# Patient Record
Sex: Female | Born: 1950
Health system: Southern US, Community
[De-identification: ages and names within clinical notes are randomized; demographics above are authoritative.]

## PROBLEM LIST (undated history)

## (undated) DIAGNOSIS — A048 Other specified bacterial intestinal infections: Secondary | ICD-10-CM

## (undated) DIAGNOSIS — K219 Gastro-esophageal reflux disease without esophagitis: Secondary | ICD-10-CM

## (undated) DIAGNOSIS — J45909 Unspecified asthma, uncomplicated: Secondary | ICD-10-CM

## (undated) DIAGNOSIS — E079 Disorder of thyroid, unspecified: Secondary | ICD-10-CM

## (undated) DIAGNOSIS — R42 Dizziness and giddiness: Secondary | ICD-10-CM

## (undated) DIAGNOSIS — I10 Essential (primary) hypertension: Secondary | ICD-10-CM

## (undated) DIAGNOSIS — C50919 Malignant neoplasm of unspecified site of unspecified female breast: Secondary | ICD-10-CM

## (undated) DIAGNOSIS — N200 Calculus of kidney: Secondary | ICD-10-CM

## (undated) HISTORY — PX: THYROIDECTOMY, PARTIAL: SHX18

## (undated) HISTORY — DX: Unspecified asthma, uncomplicated: J45.909

## (undated) HISTORY — DX: Gastro-esophageal reflux disease without esophagitis: K21.9

## (undated) HISTORY — PX: PARTIAL HYSTERECTOMY: SHX80

## (undated) HISTORY — DX: Essential (primary) hypertension: I10

## (undated) HISTORY — DX: Dizziness and giddiness: R42

## (undated) HISTORY — PX: MASTECTOMY: SHX3

## (undated) HISTORY — DX: Calculus of kidney: N20.0

## (undated) HISTORY — PX: ABDOMINAL HYSTERECTOMY: SHX81

## (undated) HISTORY — DX: Disorder of thyroid, unspecified: E07.9

---

## 2002-05-21 ENCOUNTER — Ambulatory Visit (HOSPITAL_COMMUNITY): Admission: RE | Admit: 2002-05-21 | Discharge: 2002-05-21 | Payer: Self-pay | Admitting: Surgery

## 2002-05-21 ENCOUNTER — Encounter: Payer: Self-pay | Admitting: Surgery

## 2002-06-23 ENCOUNTER — Ambulatory Visit (HOSPITAL_COMMUNITY): Admission: RE | Admit: 2002-06-23 | Discharge: 2002-06-24 | Payer: Self-pay | Admitting: Surgery

## 2013-07-23 DIAGNOSIS — I10 Essential (primary) hypertension: Secondary | ICD-10-CM | POA: Diagnosis not present

## 2013-07-30 DIAGNOSIS — I10 Essential (primary) hypertension: Secondary | ICD-10-CM | POA: Diagnosis not present

## 2013-07-30 DIAGNOSIS — Z1331 Encounter for screening for depression: Secondary | ICD-10-CM | POA: Diagnosis not present

## 2013-07-30 DIAGNOSIS — K219 Gastro-esophageal reflux disease without esophagitis: Secondary | ICD-10-CM | POA: Diagnosis not present

## 2013-07-30 DIAGNOSIS — K5900 Constipation, unspecified: Secondary | ICD-10-CM | POA: Diagnosis not present

## 2013-07-30 DIAGNOSIS — J45902 Unspecified asthma with status asthmaticus: Secondary | ICD-10-CM | POA: Diagnosis not present

## 2013-07-30 DIAGNOSIS — Z0000 Encounter for general adult medical examination without abnormal findings: Secondary | ICD-10-CM | POA: Diagnosis not present

## 2013-07-30 DIAGNOSIS — R7309 Other abnormal glucose: Secondary | ICD-10-CM | POA: Diagnosis not present

## 2013-08-06 DIAGNOSIS — H1045 Other chronic allergic conjunctivitis: Secondary | ICD-10-CM | POA: Diagnosis not present

## 2013-08-06 DIAGNOSIS — H2589 Other age-related cataract: Secondary | ICD-10-CM | POA: Diagnosis not present

## 2013-08-06 DIAGNOSIS — H538 Other visual disturbances: Secondary | ICD-10-CM | POA: Diagnosis not present

## 2013-08-06 DIAGNOSIS — H40029 Open angle with borderline findings, high risk, unspecified eye: Secondary | ICD-10-CM | POA: Diagnosis not present

## 2013-08-06 DIAGNOSIS — H43399 Other vitreous opacities, unspecified eye: Secondary | ICD-10-CM | POA: Diagnosis not present

## 2013-08-13 DIAGNOSIS — Z1231 Encounter for screening mammogram for malignant neoplasm of breast: Secondary | ICD-10-CM | POA: Diagnosis not present

## 2013-10-22 DIAGNOSIS — M6530 Trigger finger, unspecified finger: Secondary | ICD-10-CM | POA: Diagnosis not present

## 2013-11-14 DIAGNOSIS — R509 Fever, unspecified: Secondary | ICD-10-CM | POA: Diagnosis not present

## 2013-11-14 DIAGNOSIS — J45902 Unspecified asthma with status asthmaticus: Secondary | ICD-10-CM | POA: Diagnosis not present

## 2013-11-14 DIAGNOSIS — R05 Cough: Secondary | ICD-10-CM | POA: Diagnosis not present

## 2013-11-14 DIAGNOSIS — R059 Cough, unspecified: Secondary | ICD-10-CM | POA: Diagnosis not present

## 2013-11-28 DIAGNOSIS — H40023 Open angle with borderline findings, high risk, bilateral: Secondary | ICD-10-CM | POA: Diagnosis not present

## 2013-12-11 DIAGNOSIS — R739 Hyperglycemia, unspecified: Secondary | ICD-10-CM | POA: Diagnosis not present

## 2013-12-11 DIAGNOSIS — I10 Essential (primary) hypertension: Secondary | ICD-10-CM | POA: Diagnosis not present

## 2013-12-17 DIAGNOSIS — R739 Hyperglycemia, unspecified: Secondary | ICD-10-CM | POA: Diagnosis not present

## 2013-12-17 DIAGNOSIS — I10 Essential (primary) hypertension: Secondary | ICD-10-CM | POA: Diagnosis not present

## 2013-12-17 DIAGNOSIS — J4521 Mild intermittent asthma with (acute) exacerbation: Secondary | ICD-10-CM | POA: Diagnosis not present

## 2014-08-14 DIAGNOSIS — R739 Hyperglycemia, unspecified: Secondary | ICD-10-CM | POA: Diagnosis not present

## 2014-08-14 DIAGNOSIS — Z0000 Encounter for general adult medical examination without abnormal findings: Secondary | ICD-10-CM | POA: Diagnosis not present

## 2014-08-14 DIAGNOSIS — E780 Pure hypercholesterolemia: Secondary | ICD-10-CM | POA: Diagnosis not present

## 2014-08-14 DIAGNOSIS — I10 Essential (primary) hypertension: Secondary | ICD-10-CM | POA: Diagnosis not present

## 2014-08-28 DIAGNOSIS — R739 Hyperglycemia, unspecified: Secondary | ICD-10-CM | POA: Diagnosis not present

## 2014-08-28 DIAGNOSIS — K219 Gastro-esophageal reflux disease without esophagitis: Secondary | ICD-10-CM | POA: Diagnosis not present

## 2014-08-28 DIAGNOSIS — Z0000 Encounter for general adult medical examination without abnormal findings: Secondary | ICD-10-CM | POA: Diagnosis not present

## 2014-08-28 DIAGNOSIS — Z1231 Encounter for screening mammogram for malignant neoplasm of breast: Secondary | ICD-10-CM | POA: Diagnosis not present

## 2014-08-28 DIAGNOSIS — K5909 Other constipation: Secondary | ICD-10-CM | POA: Diagnosis not present

## 2014-08-28 DIAGNOSIS — J4521 Mild intermittent asthma with (acute) exacerbation: Secondary | ICD-10-CM | POA: Diagnosis not present

## 2014-08-28 DIAGNOSIS — Z1389 Encounter for screening for other disorder: Secondary | ICD-10-CM | POA: Diagnosis not present

## 2014-08-28 DIAGNOSIS — I10 Essential (primary) hypertension: Secondary | ICD-10-CM | POA: Diagnosis not present

## 2014-10-21 DIAGNOSIS — R109 Unspecified abdominal pain: Secondary | ICD-10-CM | POA: Diagnosis not present

## 2015-09-23 DIAGNOSIS — Z1231 Encounter for screening mammogram for malignant neoplasm of breast: Secondary | ICD-10-CM | POA: Diagnosis not present

## 2015-10-01 DIAGNOSIS — I10 Essential (primary) hypertension: Secondary | ICD-10-CM | POA: Diagnosis not present

## 2015-10-01 DIAGNOSIS — R739 Hyperglycemia, unspecified: Secondary | ICD-10-CM | POA: Diagnosis not present

## 2015-10-01 DIAGNOSIS — K219 Gastro-esophageal reflux disease without esophagitis: Secondary | ICD-10-CM | POA: Diagnosis not present

## 2015-10-08 DIAGNOSIS — I10 Essential (primary) hypertension: Secondary | ICD-10-CM | POA: Diagnosis not present

## 2015-10-08 DIAGNOSIS — Z0000 Encounter for general adult medical examination without abnormal findings: Secondary | ICD-10-CM | POA: Diagnosis not present

## 2015-10-08 DIAGNOSIS — Z1389 Encounter for screening for other disorder: Secondary | ICD-10-CM | POA: Diagnosis not present

## 2015-10-08 DIAGNOSIS — K5909 Other constipation: Secondary | ICD-10-CM | POA: Diagnosis not present

## 2015-10-08 DIAGNOSIS — K219 Gastro-esophageal reflux disease without esophagitis: Secondary | ICD-10-CM | POA: Diagnosis not present

## 2015-10-08 DIAGNOSIS — J4521 Mild intermittent asthma with (acute) exacerbation: Secondary | ICD-10-CM | POA: Diagnosis not present

## 2015-10-08 DIAGNOSIS — R109 Unspecified abdominal pain: Secondary | ICD-10-CM | POA: Diagnosis not present

## 2015-10-08 DIAGNOSIS — Z6827 Body mass index (BMI) 27.0-27.9, adult: Secondary | ICD-10-CM | POA: Diagnosis not present

## 2015-10-21 ENCOUNTER — Other Ambulatory Visit: Payer: Self-pay

## 2015-10-28 DIAGNOSIS — Z6827 Body mass index (BMI) 27.0-27.9, adult: Secondary | ICD-10-CM | POA: Diagnosis not present

## 2015-10-28 DIAGNOSIS — I10 Essential (primary) hypertension: Secondary | ICD-10-CM | POA: Diagnosis not present

## 2015-10-28 DIAGNOSIS — R109 Unspecified abdominal pain: Secondary | ICD-10-CM | POA: Diagnosis not present

## 2015-11-11 DIAGNOSIS — Z23 Encounter for immunization: Secondary | ICD-10-CM | POA: Diagnosis not present

## 2015-12-07 DIAGNOSIS — K219 Gastro-esophageal reflux disease without esophagitis: Secondary | ICD-10-CM | POA: Diagnosis not present

## 2015-12-07 DIAGNOSIS — Z6827 Body mass index (BMI) 27.0-27.9, adult: Secondary | ICD-10-CM | POA: Diagnosis not present

## 2015-12-07 DIAGNOSIS — I10 Essential (primary) hypertension: Secondary | ICD-10-CM | POA: Diagnosis not present

## 2015-12-10 DIAGNOSIS — R1031 Right lower quadrant pain: Secondary | ICD-10-CM | POA: Diagnosis not present

## 2015-12-10 DIAGNOSIS — N200 Calculus of kidney: Secondary | ICD-10-CM | POA: Diagnosis not present

## 2016-01-31 DIAGNOSIS — Z6827 Body mass index (BMI) 27.0-27.9, adult: Secondary | ICD-10-CM | POA: Diagnosis not present

## 2016-01-31 DIAGNOSIS — R05 Cough: Secondary | ICD-10-CM | POA: Diagnosis not present

## 2016-01-31 DIAGNOSIS — R0981 Nasal congestion: Secondary | ICD-10-CM | POA: Diagnosis not present

## 2016-02-03 ENCOUNTER — Encounter (INDEPENDENT_AMBULATORY_CARE_PROVIDER_SITE_OTHER): Payer: Self-pay

## 2016-02-03 ENCOUNTER — Encounter (INDEPENDENT_AMBULATORY_CARE_PROVIDER_SITE_OTHER): Payer: Self-pay | Admitting: Internal Medicine

## 2016-02-23 ENCOUNTER — Encounter (INDEPENDENT_AMBULATORY_CARE_PROVIDER_SITE_OTHER): Payer: Self-pay | Admitting: *Deleted

## 2016-02-23 ENCOUNTER — Ambulatory Visit (INDEPENDENT_AMBULATORY_CARE_PROVIDER_SITE_OTHER): Payer: Medicare Other | Admitting: Internal Medicine

## 2016-02-23 ENCOUNTER — Encounter (INDEPENDENT_AMBULATORY_CARE_PROVIDER_SITE_OTHER): Payer: Self-pay | Admitting: Internal Medicine

## 2016-02-23 ENCOUNTER — Other Ambulatory Visit (INDEPENDENT_AMBULATORY_CARE_PROVIDER_SITE_OTHER): Payer: Self-pay | Admitting: Internal Medicine

## 2016-02-23 DIAGNOSIS — K219 Gastro-esophageal reflux disease without esophagitis: Secondary | ICD-10-CM

## 2016-02-23 DIAGNOSIS — J45909 Unspecified asthma, uncomplicated: Secondary | ICD-10-CM

## 2016-02-23 DIAGNOSIS — J4520 Mild intermittent asthma, uncomplicated: Secondary | ICD-10-CM

## 2016-02-23 DIAGNOSIS — N200 Calculus of kidney: Secondary | ICD-10-CM

## 2016-02-23 HISTORY — DX: Unspecified asthma, uncomplicated: J45.909

## 2016-02-23 NOTE — Patient Instructions (Signed)
EGD. The risks and benefits such as perforation, bleeding, and infection were reviewed with the patient and is agreeable. 

## 2016-02-23 NOTE — Progress Notes (Signed)
   Subjective:    Patient ID: Eileen Mccoy, female    DOB: 09/16/1950, 66 y.o.   MRN: [id number]  HPIReferred by Dr. Nadara Mustard for GERD.  Per records she had a colonoscopy in 2011 which was normal.  She tells me she has GERD and she takes Omeprazole for this. She says if she stops the Omeprazole, she has acid reflux.  Has been on Omeprazole for about 2 yrs. She says OTC Omeprazole worked but was very expensive.  She denies abdominal pain.  She has a BM about twice a week with Senna and a stool softener. Her appetite is good. No weight loss.  No melena or BRRB.  No NSAIDs.  Stopped ASA 81mg  in September. She avoid spicy foods.   10/01/2015 H and H 11.6 and 35.5 HA1C 6.4  12/10/2015 CT abdomen pelvis wo CM: LLQ pain: Tiny non-obstructing renal alculi. No acute abnormality to correspond with the patient's clinical symptomatology.  Review of Systems Past Medical History:  Diagnosis Date  . Asthma 02/23/2016  . Kidney stone 02/23/2016    No past surgical history on file.  Allergies  Allergen Reactions  . Codeine     Stomach ache, nausea    No current outpatient prescriptions on file prior to visit.   No current facility-administered medications on file prior to visit.    Current Outpatient Prescriptions  Medication Sig Dispense Refill  . acetaminophen (TYLENOL) 325 MG tablet Take 650 mg by mouth every 6 (six) hours as needed.    . Albuterol Sulfate 108 (90 Base) MCG/ACT AEPB Inhale into the lungs.    . bisacodyl (DULCOLAX) 5 MG EC tablet Take 5 mg by mouth daily as needed for moderate constipation.    . calcium carbonate (OS-CAL) 1250 (500 Ca) MG chewable tablet Chew 1 tablet by mouth daily. 600mg  a day    . diphenhydrAMINE (BENADRYL) 25 MG tablet Take 25 mg by mouth every 6 (six) hours as needed.    . Fluticasone-Salmeterol (ADVAIR) 250-50 MCG/DOSE AEPB Inhale 1 puff into the lungs 2 (two) times daily.    . Multiple Vitamin (MULTIVITAMIN) tablet Take 1 tablet by mouth daily.    Marland Kitchen  Naphazoline-Pheniramine (OPCON-A OP) Apply to eye.    Marland Kitchen omeprazole (PRILOSEC) 20 MG capsule Take 20 mg by mouth daily.    . raloxifene (EVISTA) 60 MG tablet Take 60 mg by mouth daily.    [name] Sodium (SENNA S PO) Take by mouth as needed.    . vitamin E 400 UNIT capsule Take 400 Units by mouth daily.     No current facility-administered medications for this visit.           Objective:   Physical Exam Blood pressure (!) 142/70, pulse 84, temperature 98.2 F (36.8 C), height 5' 2.5" (1.588 m), weight 158 lb 12.8 oz (72 kg). Alert and oriented. Skin warm and dry. Oral mucosa is moist.   . Sclera anicteric, conjunctivae is pink. Thyroid not enlarged. No cervical lymphadenopathy. Lungs clear. Heart regular rate and rhythm.  Abdomen is soft. Bowel sounds are positive. No hepatomegaly. No abdominal masses felt. No tenderness.  No edema to lower extremities.         Assessment & Plan:  GERD. Controlled with Omeprazole.  Patient is requesting an EGD. She is interested in coming off the Omeprazole. PUD needs to be ruled out.

## 2016-02-25 ENCOUNTER — Encounter (INDEPENDENT_AMBULATORY_CARE_PROVIDER_SITE_OTHER): Payer: Self-pay

## 2016-03-16 ENCOUNTER — Ambulatory Visit (HOSPITAL_COMMUNITY)
Admission: RE | Admit: 2016-03-16 | Discharge: 2016-03-16 | Disposition: A | Discharge status: Home / Self Care | Payer: Medicare Other | Source: Ambulatory Visit | Attending: Internal Medicine | Admitting: Internal Medicine

## 2016-03-16 ENCOUNTER — Encounter (HOSPITAL_COMMUNITY)
Admission: RE | Disposition: A | Discharge status: Home / Self Care | Payer: Self-pay | Source: Ambulatory Visit | Attending: Internal Medicine

## 2016-03-16 ENCOUNTER — Encounter (HOSPITAL_COMMUNITY): Payer: Self-pay | Admitting: *Deleted

## 2016-03-16 DIAGNOSIS — K317 Polyp of stomach and duodenum: Secondary | ICD-10-CM | POA: Diagnosis not present

## 2016-03-16 DIAGNOSIS — B9681 Helicobacter pylori [H. pylori] as the cause of diseases classified elsewhere: Secondary | ICD-10-CM | POA: Diagnosis not present

## 2016-03-16 DIAGNOSIS — Z7951 Long term (current) use of inhaled steroids: Secondary | ICD-10-CM | POA: Diagnosis not present

## 2016-03-16 DIAGNOSIS — K2970 Gastritis, unspecified, without bleeding: Secondary | ICD-10-CM | POA: Insufficient documentation

## 2016-03-16 DIAGNOSIS — K219 Gastro-esophageal reflux disease without esophagitis: Secondary | ICD-10-CM | POA: Diagnosis not present

## 2016-03-16 DIAGNOSIS — J45909 Unspecified asthma, uncomplicated: Secondary | ICD-10-CM | POA: Diagnosis not present

## 2016-03-16 DIAGNOSIS — Z79899 Other long term (current) drug therapy: Secondary | ICD-10-CM | POA: Insufficient documentation

## 2016-03-16 DIAGNOSIS — K228 Other specified diseases of esophagus: Secondary | ICD-10-CM | POA: Diagnosis not present

## 2016-03-16 HISTORY — PX: POLYPECTOMY: SHX5525

## 2016-03-16 HISTORY — PX: BIOPSY: SHX5522

## 2016-03-16 HISTORY — PX: ESOPHAGOGASTRODUODENOSCOPY: SHX5428

## 2016-03-16 SURGERY — EGD (ESOPHAGOGASTRODUODENOSCOPY)
Anesthesia: Moderate Sedation

## 2016-03-16 MED ORDER — MIDAZOLAM HCL 5 MG/5ML IJ SOLN
INTRAMUSCULAR | Status: AC
  Filled 2016-03-16: qty 10

## 2016-03-16 MED ORDER — MEPERIDINE HCL 50 MG/ML IJ SOLN
INTRAMUSCULAR | Status: DC | PRN
  Administered 2016-03-16 (×2): 25 mg via INTRAVENOUS

## 2016-03-16 MED ORDER — ONDANSETRON 4 MG PO TBDP
4.0000 mg | ORAL_TABLET | Freq: Once | ORAL | Status: AC
  Administered 2016-03-16: 4 mg via ORAL

## 2016-03-16 MED ORDER — STERILE WATER FOR IRRIGATION IR SOLN
Status: DC | PRN
  Administered 2016-03-16: 2.5 mL

## 2016-03-16 MED ORDER — ONDANSETRON 4 MG PO TBDP
ORAL_TABLET | ORAL | Status: AC
  Filled 2016-03-16: qty 1

## 2016-03-16 MED ORDER — SODIUM CHLORIDE 0.9 % IV SOLN
INTRAVENOUS | Status: DC
  Administered 2016-03-16: 14:00:00 via INTRAVENOUS

## 2016-03-16 MED ORDER — MEPERIDINE HCL 50 MG/ML IJ SOLN
INTRAMUSCULAR | Status: AC
  Filled 2016-03-16: qty 1

## 2016-03-16 MED ORDER — BUTAMBEN-TETRACAINE-BENZOCAINE 2-2-14 % EX AERO
INHALATION_SPRAY | CUTANEOUS | Status: DC | PRN
  Administered 2016-03-16: 2 via TOPICAL

## 2016-03-16 MED ORDER — MIDAZOLAM HCL 5 MG/5ML IJ SOLN
INTRAMUSCULAR | Status: DC | PRN
  Administered 2016-03-16 (×3): 2 mg via INTRAVENOUS

## 2016-03-16 NOTE — Op Note (Signed)
[facility] Patient Name: Eileen Mccoy Procedure Date: 03/16/2016 2:17 PM MRN: [id number] Date of Birth: 14-Nov-1950 Attending MD: Hildred Laser , MD CSN: [id number] Age: 66 Admit Type: Outpatient Procedure:                Upper GI endoscopy Indications:              Follow-up of gastro-esophageal reflux disease Providers:                Hildred Laser, MD, Otis Peak B. Sharon Seller, RN, Aram Candela Referring MD:             Rory Percy, MD Medicines:                Cetacaine spray, Meperidine 50 mg IV, Midazolam 6                            mg IV Complications:            No immediate complications. Estimated Blood Loss:     Estimated blood loss was minimal. Procedure:                Pre-Anesthesia Assessment:                           - Prior to the procedure, a History and Physical                            was performed, and patient medications and                            allergies were reviewed. The patient's tolerance of                            previous anesthesia was also reviewed. The risks                            and benefits of the procedure and the sedation                            options and risks were discussed with the patient.                            All questions were answered, and informed consent                            was obtained. Prior Anticoagulants: The patient has                            taken no previous anticoagulant or antiplatelet                            agents. ASA Grade Assessment: II - A patient with  mild systemic disease. After reviewing the risks                            and benefits, the patient was deemed in                            satisfactory condition to undergo the procedure.                           After obtaining informed consent, the endoscope was                            passed under direct vision. Throughout the                            procedure,  the patient's blood pressure, pulse, and                            oxygen saturations were monitored continuously. The                            [id number]) scope was introduced through the                            mouth, and advanced to the second part of duodenum.                            The upper GI endoscopy was accomplished without                            difficulty. The patient tolerated the procedure                            well. Scope In: 2:49:40 PM Scope Out: 2:58:30 PM Total Procedure Duration: 0 hours 8 minutes 50 seconds  Findings:      The examined esophagus was normal.      The Z-line was irregular and was found 39 cm from the incisors.      Diffuse mild inflammation characterized by congestion (edema), erythema       and granularity was found in the gastric antrum. Biopsies were taken       with a cold forceps for histology.      Two small sessile polyps with no stigmata of recent bleeding were found       in the gastric antrum. Biopsies were taken with a cold forceps for       histology.      The exam of the stomach was otherwise normal.      The duodenal bulb and second portion of the duodenum were normal. Impression:               - Normal esophagus.                           - Z-line irregular, 39 cm from the incisors.                           -  Gastritis. Biopsied.                           - Two gastric polyps. Biopsied.                           - Normal duodenal bulb and second portion of the                            duodenum.                           Comment: No evidence of erosive esophagitis or                            Barretts. Moderate Sedation:      Moderate (conscious) sedation was administered by the endoscopy nurse       and supervised by the endoscopist. The following parameters were       monitored: oxygen saturation, heart rate, blood pressure, CO2       capnography and response to care. Total physician intraservice time  was       16 minutes. Recommendation:           - Patient has a contact number available for                            emergencies. The signs and symptoms of potential                            delayed complications were discussed with the                            patient. Return to normal activities tomorrow.                            Written discharge instructions were provided to the                            patient.                           - Resume previous diet today.                           - Continue present medications.                           - Await pathology results. Procedure Code(s):        --- Professional ---                           [phone], Esophagogastroduodenoscopy, flexible,                            transoral; with biopsy, single or multiple                           99152,  Moderate sedation services provided by the                            same physician or other qualified health care                            professional performing the diagnostic or                            therapeutic service that the sedation supports,                            requiring the presence of an independent trained                            observer to assist in the monitoring of the                            patient's level of consciousness and physiological                            status; initial 15 minutes of intraservice time,                            patient age 47 years or older Diagnosis Code(s):        --- Professional ---                           K22.8, Other specified diseases of esophagus                           K29.70, Gastritis, unspecified, without bleeding                           K31.7, Polyp of stomach and duodenum                           K21.9, Gastro-esophageal reflux disease without                            esophagitis CPT copyright 2016 American Medical Association. All rights reserved. The codes documented in this report are  preliminary and upon coder review may  be revised to meet current compliance requirements. Hildred Laser, MD Hildred Laser, MD 03/16/2016 3:12:06 PM This report has been signed electronically. Number of Addenda: 0

## 2016-03-16 NOTE — OR Nursing (Signed)
Patient nauseated and vomiting  Dr. Laural Golden notified 4mg  zofran disintegrating tablet ordered and given.

## 2016-03-16 NOTE — H&P (Signed)
Eileen Mccoy is an 66 y.o. female.   Chief Complaint: Patient is here for EGD. HPI: 66 year old African-American female was at symptoms of GERD for about 5 years. She has been OTC medications because she is on omeprazole 20 mg daily and her symptoms are well controlled. He generally has heartburn and nocturnal regurgitation. She is considering to come off PPI and was to make sure she does not have chronic changes i.e. Barrett's. He denies nausea vomiting dysphagia or abdominal pain weight loss or melena.  Past Medical History:  Diagnosis Date  . Asthma 02/23/2016  . Kidney stone 02/23/2016    Past Surgical History:  Procedure Laterality Date  . PARTIAL HYSTERECTOMY     1998 for fibroids  . THYROIDECTOMY, PARTIAL      History reviewed. No pertinent family history. Social History:  reports that she has never smoked. She has never used smokeless tobacco. She reports that she drinks alcohol. Her drug history is not on file.  Allergies:  Allergies  Allergen Reactions  . Codeine     Stomach ache, nausea    Medications Prior to Admission  Medication Sig Dispense Refill  . acetaminophen (TYLENOL) 650 MG CR tablet Take 650 mg by mouth every 8 (eight) hours as needed for pain.    . bisacodyl (DULCOLAX) 5 MG EC tablet Take 5 mg by mouth daily as needed for moderate constipation.    . Calcium Carbonate (CALCIUM 600 PO) Take 600 mg by mouth daily.    . Fluticasone-Salmeterol (ADVAIR) 250-50 MCG/DOSE AEPB Inhale 1 puff into the lungs 2 (two) times daily.    . Multiple Vitamin (MULTIVITAMIN) tablet Take 1 tablet by mouth daily.    . Naphazoline-Pheniramine (OPCON-A OP) Apply 1 drop to eye daily as needed (allergies).     Marland Kitchen omeprazole (PRILOSEC) 20 MG capsule Take 20 mg by mouth daily.    . raloxifene (EVISTA) 60 MG tablet Take 60 mg by mouth daily.    [name] Sodium (SENNA S PO) Take 1 tablet by mouth as needed (constipation).     . vitamin E 400 UNIT capsule Take 400 Units by  mouth daily.    . Albuterol Sulfate 108 (90 Base) MCG/ACT AEPB Inhale 2 puffs into the lungs every 6 (six) hours as needed (shortness of breath).     . diphenhydrAMINE (BENADRYL) 25 MG tablet Take 25 mg by mouth every 6 (six) hours as needed for allergies.       No results found for this or any previous visit (from the past 48 hour(s)). No results found.  ROS  Blood pressure (!) 169/88, pulse 95, temperature 97.8 F (36.6 C), temperature source Oral, resp. rate 16, height 5' 2.5" (1.588 m), weight 158 lb (71.7 kg), SpO2 98 %. Physical Exam  Constitutional: She appears well-developed and well-nourished.  HENT:  Mouth/Throat: Oropharynx is clear and moist.  Eyes: Conjunctivae are normal. No scleral icterus.  Neck: No thyromegaly present.  Cardiovascular: Normal rate, regular rhythm and normal heart sounds.   No murmur heard. Respiratory: Effort normal and breath sounds normal.  GI: Soft. She exhibits no distension and no mass. There is no tenderness.  Musculoskeletal: She exhibits no edema.  Lymphadenopathy:    She has no cervical adenopathy.  Neurological: She is alert.  Skin: Skin is warm and dry.     Assessment/Plan Chronic GERD. Diagnostic EGD.  Hildred Laser, MD 03/16/2016, 2:38 PM

## 2016-03-16 NOTE — OR Nursing (Signed)
Patient stated she felt better with zofran  And peppermint aromatherapy. Patient discharged without incidence

## 2016-03-16 NOTE — Discharge Instructions (Signed)
Resume usual medications and diet. No driving for 24 hours. Physician will call with biopsy results and further recommendations.   Gastric Polyps A gastric polyp, also called a stomach polyp, is a growth on the lining of the stomach. Most polyps are not dangerous, but some can be harmful because of their size, location, or type. Polyps that can become harmful include:  Large polyps. These can turn into sores (ulcers). Ulcers can lead to stomach bleeding.  Polyps that block food from moving from the stomach to the small intestine (gastric outlet obstruction).  A type of polyp called an adenoma. This type of polyp can become cancerous. What are the causes? Gastric polyps form when the lining of the stomach gets inflamed or damaged. Stomach inflammation and damage may be caused by:  A long-lasting stomach condition, such as gastritis.  Certain medicines used to reduce stomach acid.  An inherited condition called familial adenomatous polyposis. What are the signs or symptoms? Usually, this condition does not cause any symptoms. If you do have symptoms, they may include:  Pain or tenderness in the abdomen.  Nausea.  Trouble eating or swallowing.  Blood in the stool.  Anemia. How is this diagnosed? Gastric polyps are diagnosed with:  A medical procedure called endoscopy.  A lab test in which a part of the polyp is examined. This test is done with a sample of polyp tissue (biopsy) taken during an endoscopy. How is this treated? Treatment depends on the type, location, and size of the polyps. Treatment may involve:  Having the polyps checked regularly with an endoscopy.  Having the polyps removed with an endoscopy. This may be done if the polyps are harmful or can become harmful. Removing a polyp often prevents problems from developing.  Having the polyps removed with a surgery called a partial gastrectomy. This may be done in rare cases to remove very large polyps.  Treating  the underlying condition that caused the polyps. Follow these instructions at home:  Take over-the-counter and prescription medicines only as told by your health care provider.  Keep all follow-up visits as told by your health care provider. This is important. Contact a health care provider if:  You develop new symptoms.  Your symptoms get worse. Get help right away if:  You vomit blood.  You have severe abdominal pain.  You cannot eat or drink.  You have blood in your stool. This information is not intended to replace advice given to you by your health care provider. Make sure you discuss any questions you have with your health care provider. Document Released: 01/24/2012 Document Revised: 06/28/2015 Document Reviewed: 02/21/2015 Elsevier Interactive Patient Education  2017 [employer].

## 2016-03-22 ENCOUNTER — Encounter (HOSPITAL_COMMUNITY): Payer: Self-pay | Admitting: Internal Medicine

## 2016-03-28 ENCOUNTER — Telehealth (INDEPENDENT_AMBULATORY_CARE_PROVIDER_SITE_OTHER): Payer: Self-pay | Admitting: Internal Medicine

## 2016-03-28 NOTE — Telephone Encounter (Signed)
Patient called, stated that she missed a phone from our office, possibly results?  [phone] or [phone]

## 2016-03-29 ENCOUNTER — Other Ambulatory Visit (INDEPENDENT_AMBULATORY_CARE_PROVIDER_SITE_OTHER): Payer: Self-pay | Admitting: *Deleted

## 2016-03-29 MED ORDER — BIS SUBCIT-METRONID-TETRACYC 140-125-125 MG PO CAPS: 3.0000 | ORAL_CAPSULE | Freq: Four times a day (QID) | ORAL | 0 refills | Status: DC

## 2016-03-29 MED ORDER — OMEPRAZOLE 20 MG PO CPDR: 20.0000 mg | DELAYED_RELEASE_CAPSULE | Freq: Two times a day (BID) | ORAL | 0 refills | Status: DC

## 2016-03-29 NOTE — Telephone Encounter (Signed)
Addressed with Dr.[name] - he has tried multiple times to reach the patient. He stated that the patient needed to be treated with Pylera and Prilosec. Both of these were escribed to the patient's pharmacy.

## 2016-03-29 NOTE — Telephone Encounter (Signed)
Dr.[name] called the patient multiple times with no answer. Per Dr.[name] patient will need these 2 medications. They were E-Scribed to the patient's pharmacy.

## 2016-03-31 ENCOUNTER — Telehealth (INDEPENDENT_AMBULATORY_CARE_PROVIDER_SITE_OTHER): Payer: Self-pay | Admitting: *Deleted

## 2016-03-31 NOTE — Telephone Encounter (Signed)
Insurance would not cover Pylera , cost to patient was over $1000.00.  Per Dr.[name] may call in Prilosec 20 mg - take 1 po BID x 10 days #20  Clarithromycin 500 mg take 1 po BID x 10 days #20 Amoxicillin 1 gram take 1 po BID for 10 days.  This was called to the patient's pharmacy,[facility] in [place]. Patient was made aware.

## 2016-04-06 ENCOUNTER — Telehealth (INDEPENDENT_AMBULATORY_CARE_PROVIDER_SITE_OTHER): Payer: Self-pay | Admitting: Internal Medicine

## 2016-04-06 NOTE — Telephone Encounter (Signed)
Patient should be seen in the OV 1 month after completing medication.

## 2016-04-06 NOTE — Telephone Encounter (Signed)
Patient called, stated that Dr. Laural Golden has her on medication for 10 days.  She wants to know what the next step is after she finishes the medication.  [phone] OR [phone]

## 2016-04-07 NOTE — Telephone Encounter (Signed)
I called the patient and advised her and asked her to call me on Monday so that we can get this scheduled.

## 2016-04-10 ENCOUNTER — Encounter (INDEPENDENT_AMBULATORY_CARE_PROVIDER_SITE_OTHER): Payer: Self-pay | Admitting: Internal Medicine

## 2016-04-10 NOTE — Telephone Encounter (Signed)
Patient has been given  an appointment  

## 2016-05-09 ENCOUNTER — Ambulatory Visit (INDEPENDENT_AMBULATORY_CARE_PROVIDER_SITE_OTHER): Payer: Medicare Other | Admitting: Internal Medicine

## 2016-05-11 ENCOUNTER — Encounter (INDEPENDENT_AMBULATORY_CARE_PROVIDER_SITE_OTHER): Payer: Self-pay | Admitting: Internal Medicine

## 2016-05-11 ENCOUNTER — Ambulatory Visit (INDEPENDENT_AMBULATORY_CARE_PROVIDER_SITE_OTHER): Payer: Medicare Other | Admitting: Internal Medicine

## 2016-05-11 VITALS — BP 130/80 | HR 74 | Temp 98.2°F | Resp 18 | Ht 62.5 in | Wt 159.5 lb

## 2016-05-11 DIAGNOSIS — K2970 Gastritis, unspecified, without bleeding: Secondary | ICD-10-CM | POA: Diagnosis not present

## 2016-05-11 DIAGNOSIS — B9681 Helicobacter pylori [H. pylori] as the cause of diseases classified elsewhere: Secondary | ICD-10-CM | POA: Diagnosis not present

## 2016-05-11 DIAGNOSIS — K219 Gastro-esophageal reflux disease without esophagitis: Secondary | ICD-10-CM | POA: Diagnosis not present

## 2016-05-11 NOTE — Progress Notes (Signed)
Presenting complaint;  Follow-up for GERD and an H. pylori infection.  Subjective:  Patient is 66 year old African-American female who has chronic GERD and wanted to come off PPI. She underwent EGD on 03/16/2016. She did not have at its esophagus. He had gastritis and 2 small polyps. Biopsy revealed pylori infection and polyploid foveoler hyperplasia. She was prescribed Pylera which she was able to finish even though she had some side effects.  She feels much better. She is watching her diet. She is not taking PPI on daily basis. She is only using it on as-needed basis. She denies nausea vomiting or abdominal pain. She states she has been doing her own reading regarding H. pylori infection. She wants to make sure that infection has been eradicated.    Current Medications: Outpatient Encounter Prescriptions as of 05/11/2016  Medication Sig  . acetaminophen (TYLENOL) 650 MG CR tablet Take 650 mg by mouth every 8 (eight) hours as needed for pain.  . Albuterol Sulfate 108 (90 Base) MCG/ACT AEPB Inhale 2 puffs into the lungs every 6 (six) hours as needed (shortness of breath).   . Calcium Carbonate (CALCIUM 600 PO) Take 600 mg by mouth daily.  . diphenhydrAMINE (BENADRYL) 25 MG tablet Take 25 mg by mouth every 6 (six) hours as needed for allergies.   . Fluticasone-Salmeterol (ADVAIR) 250-50 MCG/DOSE AEPB Inhale 1 puff into the lungs 2 (two) times daily.  . Multiple Vitamin (MULTIVITAMIN) tablet Take 1 tablet by mouth daily.  . Naphazoline-Pheniramine (OPCON-A OP) Apply 1 drop to eye daily as needed (allergies).   Marland Kitchen omeprazole (PRILOSEC) 20 MG capsule Take 20 mg by mouth daily.  . raloxifene (EVISTA) 60 MG tablet Take 60 mg by mouth daily.  [name] Sodium (SENNA S PO) Take 1 tablet by mouth as needed (constipation).   . [DISCONTINUED] bisacodyl (DULCOLAX) 5 MG EC tablet Take 5 mg by mouth daily as needed for moderate constipation.  . [DISCONTINUED] bismuth-metronidazole-tetracycline  (PYLERA) 140-125-125 MG capsule Take 3 capsules by mouth 4 (four) times daily. Before meals and at bedtime. (Patient not taking: Reported on 05/11/2016)  . [DISCONTINUED] omeprazole (PRILOSEC) 20 MG capsule Take 1 capsule (20 mg total) by mouth 2 (two) times daily before a meal.  . [DISCONTINUED] vitamin E 400 UNIT capsule Take 400 Units by mouth daily.   No facility-administered encounter medications on file as of 05/11/2016.      Objective: Blood pressure 130/80, pulse 74, temperature 98.2 F (36.8 C), temperature source Oral, resp. rate 18, height 5' 2.5" (1.588 m), weight 159 lb 8 oz (72.3 kg). Patient is alert and in no acute distress. Conjunctiva is pink. Sclera is nonicteric Oropharyngeal mucosa is normal. No neck masses or thyromegaly noted. Cardiac exam with regular rhythm normal S1 and S2. No murmur or gallop noted. Lungs are clear to auscultation. Abdomen is full but soft and nontender without organomegaly or masses. No LE edema or clubbing noted.    Assessment:  #1. GERD. Recent EGD was negative for erosive reflux esophagitis or Barrett's esophagus. There is no reason why she cannot use PPI on demand other than every day. If symptom control is not satisfactory treatment options can be reviewed again. #2. H pylori gastritis. Patient was treated with Pylera. Will do follow-up testing to confirm eradication.   Plan:  Continue anti-reflux measures. Use omeprazole on as-needed basis. For example she can take this medication when she eats late or she is traveling or attending birthday party etc. H. pylori stool antigen. Office visit on  as-needed basis.

## 2016-05-11 NOTE — Patient Instructions (Signed)
Physician will call with results of H. pylori stool test when completed.

## 2016-05-12 DIAGNOSIS — K2970 Gastritis, unspecified, without bleeding: Secondary | ICD-10-CM | POA: Diagnosis not present

## 2016-05-12 DIAGNOSIS — B9681 Helicobacter pylori [H. pylori] as the cause of diseases classified elsewhere: Secondary | ICD-10-CM | POA: Diagnosis not present

## 2016-05-15 LAB — HELICOBACTER PYLORI  SPECIAL ANTIGEN: H. PYLORI ANTIGEN STOOL: NOT DETECTED

## 2016-10-09 DIAGNOSIS — Z1231 Encounter for screening mammogram for malignant neoplasm of breast: Secondary | ICD-10-CM | POA: Diagnosis not present

## 2016-10-30 DIAGNOSIS — I10 Essential (primary) hypertension: Secondary | ICD-10-CM | POA: Diagnosis not present

## 2016-10-30 DIAGNOSIS — R739 Hyperglycemia, unspecified: Secondary | ICD-10-CM | POA: Diagnosis not present

## 2016-10-30 DIAGNOSIS — K219 Gastro-esophageal reflux disease without esophagitis: Secondary | ICD-10-CM | POA: Diagnosis not present

## 2016-10-30 DIAGNOSIS — E559 Vitamin D deficiency, unspecified: Secondary | ICD-10-CM | POA: Diagnosis not present

## 2016-10-30 DIAGNOSIS — D519 Vitamin B12 deficiency anemia, unspecified: Secondary | ICD-10-CM | POA: Diagnosis not present

## 2016-11-02 DIAGNOSIS — Z23 Encounter for immunization: Secondary | ICD-10-CM | POA: Diagnosis not present

## 2016-11-23 DIAGNOSIS — Z0000 Encounter for general adult medical examination without abnormal findings: Secondary | ICD-10-CM | POA: Diagnosis not present

## 2016-11-23 DIAGNOSIS — J4521 Mild intermittent asthma with (acute) exacerbation: Secondary | ICD-10-CM | POA: Diagnosis not present

## 2016-11-23 DIAGNOSIS — Z1389 Encounter for screening for other disorder: Secondary | ICD-10-CM | POA: Diagnosis not present

## 2016-11-23 DIAGNOSIS — K219 Gastro-esophageal reflux disease without esophagitis: Secondary | ICD-10-CM | POA: Diagnosis not present

## 2016-11-23 DIAGNOSIS — Z6828 Body mass index (BMI) 28.0-28.9, adult: Secondary | ICD-10-CM | POA: Diagnosis not present

## 2017-01-02 ENCOUNTER — Ambulatory Visit (INDEPENDENT_AMBULATORY_CARE_PROVIDER_SITE_OTHER): Payer: Medicare Other | Admitting: Family Medicine

## 2017-01-02 ENCOUNTER — Encounter: Payer: Self-pay | Admitting: Family Medicine

## 2017-01-02 ENCOUNTER — Other Ambulatory Visit: Payer: Self-pay

## 2017-01-02 VITALS — BP 154/82 | HR 92 | Temp 96.5°F | Resp 16 | Ht 63.0 in | Wt 161.1 lb

## 2017-01-02 DIAGNOSIS — J4540 Moderate persistent asthma, uncomplicated: Secondary | ICD-10-CM | POA: Diagnosis not present

## 2017-01-02 DIAGNOSIS — R030 Elevated blood-pressure reading, without diagnosis of hypertension: Secondary | ICD-10-CM | POA: Diagnosis not present

## 2017-01-02 DIAGNOSIS — I700 Atherosclerosis of aorta: Secondary | ICD-10-CM | POA: Diagnosis not present

## 2017-01-02 DIAGNOSIS — E785 Hyperlipidemia, unspecified: Secondary | ICD-10-CM | POA: Diagnosis not present

## 2017-01-02 DIAGNOSIS — R7303 Prediabetes: Secondary | ICD-10-CM | POA: Diagnosis not present

## 2017-01-02 DIAGNOSIS — J4530 Mild persistent asthma, uncomplicated: Secondary | ICD-10-CM | POA: Insufficient documentation

## 2017-01-02 DIAGNOSIS — K5790 Diverticulosis of intestine, part unspecified, without perforation or abscess without bleeding: Secondary | ICD-10-CM | POA: Insufficient documentation

## 2017-01-02 NOTE — Progress Notes (Signed)
Chief Complaint  Patient presents with  . Hypertension    est care   Eileen Mccoy 66 year old woman here for her first visit to establish care.  Her prior PCP is retiring.  She is up-to-date with her health care.  She has had a mammogram this year.  She has had 2 colonoscopies.  Her immunizations are up-to-date, except for shingles.  The shingles shot is not covered by her insurance.  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  Her husband is retired. She has a diagnosis of hypertension but is not on blood pressure medication.  She states she used to take blood pressure medication but then was taken off of it.  She has recently gained weight and her blood pressure is elevated today.  We discussed that she should try to increase her activity, lose a few pounds and see whether her blood pressure will come down.  I get 154/82, not a dangerous number but higher than it should be.  She states that she is nervous, and that she did not sleep well last night. She has a history of adult asthma.  She is well controlled on Advair.  Rarely uses albuterol.  Has not been hospitalized for asthma. She has a diagnosis of kidney stones in her chart.  This was seen incidentally on a CAT scan.  She does not believe it because she is never had kidney problems.  This CAT scan also showed aortic atherosclerosis.  She was unaware of this.  Her cholesterol was checked in September, her LDL was 120.  She has never been a smoker.  There is some heart disease in her family. She had a hysterectomy in her 26s for fibroids.  She states that they left her ovaries.  She still has periodic hot flashes.  She has not had a Pap smear in many years.  It is not indicated now that she is 66.  She was placed on EVista many years ago for her bones.  She does not recall having a bone density scan.  She wonders whether she needs to continue taking this medication.  I will request her old records and see.  Patient Active  Problem List   Diagnosis Date Noted  . Diverticulosis of intestine 01/02/2017  . Atherosclerosis of aorta ([place]) 01/02/2017  . Asthma in adult, mild persistent, uncomplicated 16/11/9602  . Kidney stone 02/23/2016  . Gastroesophageal reflux disease without esophagitis 02/23/2016    Outpatient Encounter Medications as of 01/02/2017  Medication Sig  . acetaminophen (TYLENOL) 650 MG CR tablet Take 650 mg by mouth every 8 (eight) hours as needed for pain.  . Albuterol Sulfate 108 (90 Base) MCG/ACT AEPB Inhale 2 puffs into the lungs every 6 (six) hours as needed (shortness of breath).   . Calcium Carbonate (CALCIUM 600 PO) Take 600 mg by mouth daily.  . diphenhydrAMINE (BENADRYL) 25 MG tablet Take 25 mg by mouth every 6 (six) hours as needed for allergies.   . Fluticasone-Salmeterol (ADVAIR) 250-50 MCG/DOSE AEPB Inhale 1 puff into the lungs 2 (two) times daily.  . Multiple Vitamin (MULTIVITAMIN) tablet Take 1 tablet by mouth daily.  . Naphazoline-Pheniramine (OPCON-A OP) Apply 1 drop to eye daily as needed (allergies).   Marland Kitchen omeprazole (PRILOSEC) 20 MG capsule Take 20 mg by mouth daily.  . raloxifene (EVISTA) 60 MG tablet Take 60 mg by mouth daily.  [name] Sodium (SENNA S PO) Take 1 tablet by mouth as needed (constipation).  No facility-administered encounter medications on file as of 01/02/2017.     Past Medical History:  Diagnosis Date  . Asthma 02/23/2016  . GERD (gastroesophageal reflux disease)   . Hypertension   . Kidney stone 02/23/2016  . Thyroid disease     Past Surgical History:  Procedure Laterality Date  . ABDOMINAL HYSTERECTOMY    . PARTIAL HYSTERECTOMY     1998 for fibroids  . THYROIDECTOMY, PARTIAL      Social History   Socioeconomic History  . Marital status: Married    Spouse name: Hendricks Milo  . Number of children: 2  . Years of education: 55  . Highest education level: Not on file  Social Needs  . Financial resource strain: Not on file  . Food  insecurity - worry: Not on file  . Food insecurity - inability: Not on file  . Transportation needs - medical: Not on file  . Transportation needs - non-medical: Not on file  Occupational History  . Occupation: CNA  Tobacco Use  . Smoking status: Never Smoker  . Smokeless tobacco: Never Used  Substance and Sexual Activity  . Alcohol use: Yes    Comment: occasionally  . Drug use: No  . Sexual activity: Yes  Other Topics Concern  . Not on file  Social History Narrative   Married to [place]   He is retired from [employer] is semi retired   2 daughters, two grandsons in [place]    Family History  Problem Relation Age of Onset  . Heart disease Mother   . Asthma Mother   . COPD Mother 12       COPD  . Cancer Father   . Alcohol abuse Father   . COPD Sister 56  . Alcohol abuse Brother   . Heart disease Sister 86    Review of Systems  Constitutional: Negative for chills, fever and weight loss.  HENT: Negative for congestion and hearing loss.   Eyes: Negative for blurred vision and pain.  Respiratory: Negative for cough and shortness of breath.   Cardiovascular: Negative for chest pain and leg swelling.  Gastrointestinal: Negative for abdominal pain, constipation, diarrhea and heartburn.  Genitourinary: Negative for dysuria and frequency.  Musculoskeletal: Negative for falls, joint pain and myalgias.  Neurological: Negative for dizziness, seizures and headaches.  Psychiatric/Behavioral: Negative for depression. The patient is not nervous/anxious and does not have insomnia.     BP (!) 178/92 (BP Location: Right Arm, Patient Position: Sitting, Cuff Size: Normal)   Pulse 92   Temp (!) 96.5 F (35.8 C) (Temporal)   Resp 16   Ht 5\' 3"  (1.6 m)   Wt 161 lb 1.3 oz (73.1 kg)   LMP 02/21/1976 (Approximate)   SpO2 97%   BMI 28.53 kg/m   Physical Exam  Constitutional: She is oriented to person, place, and time. She appears well-developed and well-nourished.  HENT:    Head: Normocephalic and atraumatic.  Mouth/Throat: Oropharynx is clear and moist.  Eyes: Conjunctivae are normal. Pupils are equal, round, and reactive to light.  Neck: Normal range of motion. Neck supple. No thyromegaly present.  Cardiovascular: Normal rate, regular rhythm and normal heart sounds.  Pulmonary/Chest: Effort normal and breath sounds normal. No respiratory distress.  Musculoskeletal: Normal range of motion. She exhibits no edema.  Lymphadenopathy:    She has no cervical adenopathy.  Neurological: She is alert and oriented to person, place, and time.  Gait normal  Skin: Skin is warm and dry.  Psychiatric: She has a normal mood and affect. Her behavior is normal. Thought content normal.  Nursing note and vitals reviewed.   1. Elevated blood pressure, situational  2. Pre-diabetes 3.  Hyperlipidemia 4.  Aortic atherosclerosis 5.  Asthma in adult  Possible osteoporosis  Patient Instructions  Need records Dr Nadara Mustard Need Bone density / radiology from [facility] every day that you are able  Follow DASH eating plan for BP  See me in a month or two      Raylene Everts, MD

## 2017-01-02 NOTE — Patient Instructions (Addendum)
Need records Dr Nadara Mustard Need Bone density / radiology from [facility] every day that you are able  Follow DASH eating plan for BP  See me in a month or two

## 2017-01-04 ENCOUNTER — Encounter: Payer: Self-pay | Admitting: Family Medicine

## 2017-01-15 ENCOUNTER — Telehealth: Payer: Self-pay | Admitting: Family Medicine

## 2017-01-15 NOTE — Telephone Encounter (Signed)
Returned call to patient, no answer, no information to leave voicemail.

## 2017-01-23 DIAGNOSIS — H40023 Open angle with borderline findings, high risk, bilateral: Secondary | ICD-10-CM | POA: Diagnosis not present

## 2017-01-23 DIAGNOSIS — H02884 Meibomian gland dysfunction left upper eyelid: Secondary | ICD-10-CM | POA: Diagnosis not present

## 2017-01-23 DIAGNOSIS — H10423 Simple chronic conjunctivitis, bilateral: Secondary | ICD-10-CM | POA: Diagnosis not present

## 2017-01-23 DIAGNOSIS — H02886 Meibomian gland dysfunction of left eye, unspecified eyelid: Secondary | ICD-10-CM | POA: Diagnosis not present

## 2017-01-23 DIAGNOSIS — H43393 Other vitreous opacities, bilateral: Secondary | ICD-10-CM | POA: Diagnosis not present

## 2017-01-23 DIAGNOSIS — H16223 Keratoconjunctivitis sicca, not specified as Sjogren's, bilateral: Secondary | ICD-10-CM | POA: Diagnosis not present

## 2017-03-08 ENCOUNTER — Ambulatory Visit: Payer: Medicare Other | Admitting: Family Medicine

## 2017-03-09 ENCOUNTER — Ambulatory Visit: Payer: Medicare Other | Admitting: Family Medicine

## 2017-03-09 ENCOUNTER — Other Ambulatory Visit: Payer: Self-pay

## 2017-03-09 ENCOUNTER — Encounter: Payer: Self-pay | Admitting: Family Medicine

## 2017-03-09 VITALS — BP 128/76 | HR 84 | Temp 98.2°F | Resp 18 | Ht 63.0 in | Wt 163.1 lb

## 2017-03-09 DIAGNOSIS — R030 Elevated blood-pressure reading, without diagnosis of hypertension: Secondary | ICD-10-CM

## 2017-03-09 DIAGNOSIS — E785 Hyperlipidemia, unspecified: Secondary | ICD-10-CM

## 2017-03-09 DIAGNOSIS — R7303 Prediabetes: Secondary | ICD-10-CM

## 2017-03-09 MED ORDER — ALBUTEROL SULFATE 108 (90 BASE) MCG/ACT IN AEPB: 2.0000 | INHALATION_SPRAY | Freq: Four times a day (QID) | RESPIRATORY_TRACT | 1 refills | Status: AC | PRN

## 2017-03-09 MED ORDER — OMEPRAZOLE 20 MG PO CPDR: 20.0000 mg | DELAYED_RELEASE_CAPSULE | Freq: Every day | ORAL | 3 refills | Status: DC

## 2017-03-09 MED ORDER — FLUTICASONE-SALMETEROL 250-50 MCG/DOSE IN AEPB: 1.0000 | INHALATION_SPRAY | Freq: Two times a day (BID) | RESPIRATORY_TRACT | 3 refills | Status: DC

## 2017-03-09 MED ORDER — OMEPRAZOLE 20 MG PO CPDR: 20.0000 mg | DELAYED_RELEASE_CAPSULE | Freq: Every day | ORAL | 3 refills | Status: AC

## 2017-03-09 NOTE — Patient Instructions (Addendum)
Need a mammogram every year Next fall we will do a bone density test Stop the ranexa I have refilled the omeprazole  See me in the fall for a physical and repeat lab tests ( Aug or Sept) Continue to eat well and stay active

## 2017-03-09 NOTE — Progress Notes (Signed)
Chief Complaint  Patient presents with  . Follow-up    2 month   Patient is back for routine follow-up.  She questions whether she needs to continue taking her osteoporosis medicine. She states that her last bone density was many years ago.  The only one I can find in her chart was 2007.  It was normal.  She thinks her doctor put her on the osteoporosis medication because she is on long-term omeprazole.  Her doctor told her that the omeprazole put her at risk for diminished bone density. Her blood pressure is improved today.  She has not on blood pressure medication.  Her initial blood pressure was elevated but after rest it came back to normal.  She states she takes her blood pressure at home and is generally in the 130/70 range. Her only health concern is her asthma.  She states that her mother died of it complications from asthma.  Actually her mother had multiple medical problems and her asthma was not well managed.  In any event, this patient is compliant with her Advair, has no wheezing.  She uses albuterol perhaps once a month.  No hospitalizations for asthma.  I reassured her that this should not shorten her life. She tells me she is up-to-date with health maintenance.  She is had her pneumonia shots.  I will try to find them in her old records.   Patient Active Problem List   Diagnosis Date Noted  . Diverticulosis of intestine 01/02/2017  . Atherosclerosis of aorta ([place]) 01/02/2017  . Asthma in adult, mild persistent, uncomplicated 84/16/6063  . Kidney stone 02/23/2016  . Gastroesophageal reflux disease without esophagitis 02/23/2016    Outpatient Encounter Medications as of 03/09/2017  Medication Sig  . acetaminophen (TYLENOL) 650 MG CR tablet Take 650 mg by mouth every 8 (eight) hours as needed for pain.  . Albuterol Sulfate 108 (90 Base) MCG/ACT AEPB Inhale 2 puffs into the lungs every 6 (six) hours as needed (shortness of breath).  . Calcium Carbonate (CALCIUM 600 PO) Take  600 mg by mouth daily.  . diphenhydrAMINE (BENADRYL) 25 MG tablet Take 25 mg by mouth every 6 (six) hours as needed for allergies.   . Fluticasone-Salmeterol (ADVAIR) 250-50 MCG/DOSE AEPB Inhale 1 puff into the lungs 2 (two) times daily.  . Multiple Vitamin (MULTIVITAMIN) tablet Take 1 tablet by mouth daily.  . Naphazoline-Pheniramine (OPCON-A OP) Apply 1 drop to eye daily as needed (allergies).   Marland Kitchen omeprazole (PRILOSEC) 20 MG capsule Take 1 capsule (20 mg total) by mouth daily.  . raloxifene (EVISTA) 60 MG tablet Take 60 mg by mouth daily.  [name] Sodium (SENNA S PO) Take 1 tablet by mouth as needed (constipation).    No facility-administered encounter medications on file as of 03/09/2017.     Allergies  Allergen Reactions  . Codeine     Stomach ache, nausea    Review of Systems  Constitutional: Negative for activity change, appetite change and unexpected weight change.  HENT: Negative for congestion, dental problem, postnasal drip and rhinorrhea.   Eyes: Negative for redness and visual disturbance.  Respiratory: Negative for cough and shortness of breath.   Cardiovascular: Negative for chest pain, palpitations and leg swelling.  Gastrointestinal: Negative for abdominal pain, constipation and diarrhea.  Genitourinary: Negative for difficulty urinating and frequency.  Musculoskeletal: Negative for arthralgias and back pain.  Neurological: Negative for dizziness and headaches.  Psychiatric/Behavioral: Negative for dysphoric mood and sleep disturbance. The patient is not  nervous/anxious.      BP 128/76   Pulse 84   Temp 98.2 F (36.8 C) (Temporal)   Resp 18   Ht 5\' 3"  (1.6 m)   Wt 163 lb 1.3 oz (74 kg)   LMP 02/21/1976 (Approximate)   SpO2 98%   BMI 28.89 kg/m   Physical Exam  Constitutional: She is oriented to person, place, and time. She appears well-developed and well-nourished.  HENT:  Head: Normocephalic and atraumatic.  Mouth/Throat: Oropharynx is  clear and moist.  Eyes: Conjunctivae are normal. Pupils are equal, round, and reactive to light.  Neck: Normal range of motion. Neck supple. No thyromegaly present.  Cardiovascular: Normal rate, regular rhythm and normal heart sounds.  Pulmonary/Chest: Effort normal and breath sounds normal. No respiratory distress.  Lungs are clear  Abdominal: Soft. Bowel sounds are normal.  Musculoskeletal: Normal range of motion. She exhibits no edema.  Lymphadenopathy:    She has no cervical adenopathy.  Neurological: She is alert and oriented to person, place, and time.  Gait normal  Skin: Skin is warm and dry.  Psychiatric: She has a normal mood and affect. Her behavior is normal. Thought content normal.  Nursing note and vitals reviewed.   ASSESSMENT/PLAN:  1. Elevated blood pressure, situational Improved with rest.  No medication indicated  2. Pre-diabetes In chart.  Discussed diet and exercise  3. Hyperlipidemia, unspecified hyperlipidemia type Discussed with patient the need to take medication for atherosclerosis.  She declines.  I will review her old lab test, and follow her lipids.   Patient Instructions  Need a mammogram every year Next fall we will do a bone density test Stop the ranexa I have refilled the omeprazole  See me in the fall for a physical and repeat lab tests ( Aug or Sept) Continue to eat well and stay active   Raylene Everts, MD

## 2017-03-12 DIAGNOSIS — H40023 Open angle with borderline findings, high risk, bilateral: Secondary | ICD-10-CM | POA: Diagnosis not present

## 2017-04-30 ENCOUNTER — Encounter: Payer: Self-pay | Admitting: Family Medicine

## 2017-05-03 DIAGNOSIS — R030 Elevated blood-pressure reading, without diagnosis of hypertension: Secondary | ICD-10-CM | POA: Diagnosis not present

## 2017-05-03 DIAGNOSIS — K219 Gastro-esophageal reflux disease without esophagitis: Secondary | ICD-10-CM | POA: Diagnosis not present

## 2017-05-03 DIAGNOSIS — J0100 Acute maxillary sinusitis, unspecified: Secondary | ICD-10-CM | POA: Diagnosis not present

## 2017-05-03 DIAGNOSIS — J4540 Moderate persistent asthma, uncomplicated: Secondary | ICD-10-CM | POA: Diagnosis not present

## 2017-05-03 DIAGNOSIS — Z6829 Body mass index (BMI) 29.0-29.9, adult: Secondary | ICD-10-CM | POA: Diagnosis not present

## 2017-05-04 ENCOUNTER — Ambulatory Visit: Payer: Medicare Other | Admitting: Family Medicine

## 2017-05-14 DIAGNOSIS — R739 Hyperglycemia, unspecified: Secondary | ICD-10-CM | POA: Diagnosis not present

## 2017-05-14 DIAGNOSIS — K219 Gastro-esophageal reflux disease without esophagitis: Secondary | ICD-10-CM | POA: Diagnosis not present

## 2017-05-14 DIAGNOSIS — I10 Essential (primary) hypertension: Secondary | ICD-10-CM | POA: Diagnosis not present

## 2017-05-17 DIAGNOSIS — J4540 Moderate persistent asthma, uncomplicated: Secondary | ICD-10-CM | POA: Diagnosis not present

## 2017-05-17 DIAGNOSIS — I10 Essential (primary) hypertension: Secondary | ICD-10-CM | POA: Diagnosis not present

## 2017-05-17 DIAGNOSIS — Z0000 Encounter for general adult medical examination without abnormal findings: Secondary | ICD-10-CM | POA: Diagnosis not present

## 2017-05-17 DIAGNOSIS — R7301 Impaired fasting glucose: Secondary | ICD-10-CM | POA: Diagnosis not present

## 2017-05-17 DIAGNOSIS — E782 Mixed hyperlipidemia: Secondary | ICD-10-CM | POA: Diagnosis not present

## 2017-05-17 DIAGNOSIS — Z713 Dietary counseling and surveillance: Secondary | ICD-10-CM | POA: Diagnosis not present

## 2017-06-26 ENCOUNTER — Ambulatory Visit: Payer: Medicare Other | Admitting: Pulmonary Disease

## 2017-06-26 ENCOUNTER — Encounter: Payer: Self-pay | Admitting: Pulmonary Disease

## 2017-06-26 ENCOUNTER — Other Ambulatory Visit (INDEPENDENT_AMBULATORY_CARE_PROVIDER_SITE_OTHER): Payer: Medicare Other

## 2017-06-26 VITALS — BP 142/80 | HR 84 | Ht 62.5 in | Wt 160.4 lb

## 2017-06-26 DIAGNOSIS — J4530 Mild persistent asthma, uncomplicated: Secondary | ICD-10-CM

## 2017-06-26 DIAGNOSIS — R0602 Shortness of breath: Secondary | ICD-10-CM

## 2017-06-26 LAB — CBC WITH DIFFERENTIAL/PLATELET
BASOS ABS: 0.1 10*3/uL (ref 0.0–0.1)
Basophils Relative: 1.1 % (ref 0.0–3.0)
EOS ABS: 0.2 10*3/uL (ref 0.0–0.7)
Eosinophils Relative: 2.4 % (ref 0.0–5.0)
HEMATOCRIT: 38.6 % (ref 36.0–46.0)
Hemoglobin: 12.9 g/dL (ref 12.0–15.0)
LYMPHS PCT: 37 % (ref 12.0–46.0)
Lymphs Abs: 3.3 10*3/uL (ref 0.7–4.0)
MCHC: 33.5 g/dL (ref 30.0–36.0)
MCV: 84.9 fl (ref 78.0–100.0)
MONO ABS: 0.6 10*3/uL (ref 0.1–1.0)
Monocytes Relative: 6.3 % (ref 3.0–12.0)
NEUTROS ABS: 4.8 10*3/uL (ref 1.4–7.7)
NEUTROS PCT: 53.2 % (ref 43.0–77.0)
PLATELETS: 362 10*3/uL (ref 150.0–400.0)
RBC: 4.55 Mil/uL (ref 3.87–5.11)
RDW: 14.7 % (ref 11.5–15.5)
WBC: 8.9 10*3/uL (ref 4.0–10.5)

## 2017-06-26 LAB — NITRIC OXIDE: NITRIC OXIDE: 54

## 2017-06-26 NOTE — Progress Notes (Signed)
Eileen Mccoy    [id number]    [birth year]  Primary Care Physician:Mccoy, Eileen Areola, MD  Referring Physician: Celene Squibb, MD [address] Eileen Mcc[place], [place] [address]  Chief complaint: Consult for asthma  HPI: 67 year old with history of allergic rhinitis, sinusitis, GERD, asthma, hypertension She was diagnosed with asthma about 7 years ago and has been maintained on Advair 250.  She is using the Advair about once a day.  She is also on albuterol which he uses 1-2 times through the week.  Has occasional nocturnal awakenings. Has seasonal allergies, postnasal drip, allergic rhinitis for which she is using Zyrtec over-the-counter She also has acid reflux for which she is on Prilosec  Pets: Exposed to cat in her daughter's home.  Feels that she is allergic to it.  No birds, farm animals Occupation: Works as a Quarry manager.  Previously worked at a [employer] Exposures: Exposed to [employer] while in a [employer].  Has a damp basement and feels there may be mold in Smoking history: Never smoker Travel history: Lived in [place] all her life.  No significant travel Relevant family history: Mother had asthma and died from an exacerbation.  Outpatient Encounter Medications as of 06/26/2017  Medication Sig  . acetaminophen (TYLENOL) 650 MG CR tablet Take 650 mg by mouth every 8 (eight) hours as needed for pain.  . Albuterol Sulfate 108 (90 Base) MCG/ACT AEPB Inhale 2 puffs into the lungs every 6 (six) hours as needed (shortness of breath).  . Calcium Carbonate (CALCIUM 600 PO) Take 600 mg by mouth daily.  . diphenhydrAMINE (BENADRYL) 25 MG tablet Take 25 mg by mouth every 6 (six) hours as needed for allergies.   . Fluticasone-Salmeterol (ADVAIR) 250-50 MCG/DOSE AEPB Inhale 1 puff into the lungs 2 (two) times daily.  . Multiple Vitamin (MULTIVITAMIN) tablet Take 1 tablet by mouth daily.  . Naphazoline-Pheniramine (OPCON-A OP) Apply 1 drop to eye daily as needed (allergies).   Marland Kitchen  omeprazole (PRILOSEC) 20 MG capsule Take 1 capsule (20 mg total) by mouth daily.  . raloxifene (EVISTA) 60 MG tablet Take 60 mg by mouth daily.  [name] Sodium (SENNA S PO) Take 1 tablet by mouth as needed (constipation).   . hydrochlorothiazide (HYDRODIURIL) 12.5 MG tablet Take 12.5 mg by mouth daily.   No facility-administered encounter medications on file as of 06/26/2017.     Allergies as of 06/26/2017 - Review Complete 06/26/2017  Allergen Reaction Noted  . Codeine  02/23/2016    Past Medical History:  Diagnosis Date  . Asthma 02/23/2016  . GERD (gastroesophageal reflux disease)   . Hypertension   . Kidney stone 02/23/2016  . Thyroid disease     Past Surgical History:  Procedure Laterality Date  . ABDOMINAL HYSTERECTOMY    . BIOPSY  03/16/2016   Procedure: BIOPSY;  Surgeon: Rogene Houston, MD;  Location: [facility];  Service: Endoscopy;;  gastric biopsies  . ESOPHAGOGASTRODUODENOSCOPY N/A 03/16/2016   Procedure: ESOPHAGOGASTRODUODENOSCOPY (EGD);  Surgeon: Rogene Houston, MD;  Location: [facility];  Service: Endoscopy;  Laterality: N/A;  3:00  . PARTIAL HYSTERECTOMY     1998 for fibroids  . POLYPECTOMY  03/16/2016   Procedure: POLYPECTOMY;  Surgeon: Rogene Houston, MD;  Location: [facility];  Service: Endoscopy;;  gastric polypectomies  . THYROIDECTOMY, PARTIAL      Family History  Problem Relation Age of Onset  . Heart disease Mother   .  Asthma Mother   . COPD Mother 28       COPD  . Cancer Father   . Alcohol abuse Father   . COPD Sister 32  . Alcohol abuse Brother   . Heart disease Sister 51    Social History   Socioeconomic History  . Marital status: Married    Spouse name: Eileen Mccoy  . Number of children: 2  . Years of education: 37  . Highest education level: Not on file  Occupational History  . Occupation: CNA  Social Needs  . Financial resource strain: Not on file  . Food insecurity:    Worry: Not on file    Inability: Not on file    . Transportation needs:    Medical: Not on file    Non-medical: Not on file  Tobacco Use  . Smoking status: Never Smoker  . Smokeless tobacco: Never Used  Substance and Sexual Activity  . Alcohol use: Yes    Comment: occasionally  . Drug use: No  . Sexual activity: Yes  Lifestyle  . Physical activity:    Days per week: 2 days    Minutes per session: 30 min  . Stress: Not on file  Relationships  . Social connections:    Talks on phone: Not on file    Gets together: Not on file    Attends religious service: Not on file    Active member of club or organization: Not on file    Attends meetings of clubs or organizations: Not on file    Relationship status: Not on file  . Intimate partner violence:    Fear of current or ex partner: Not on file    Emotionally abused: Not on file    Physically abused: Not on file    Forced sexual activity: Not on file  Other Topics Concern  . Not on file  Social History Narrative   Married to [place]   He is retired from [employer] is semi retired   2 daughters, two grandsons in [place]    Review of systems: Review of Systems  Constitutional: Negative for fever and chills.  HENT: Negative.   Eyes: Negative for blurred vision.  Respiratory: as per HPI  Cardiovascular: Negative for chest pain and palpitations.  Gastrointestinal: Negative for vomiting, diarrhea, blood per rectum. Genitourinary: Negative for dysuria, urgency, frequency and hematuria.  Musculoskeletal: Negative for myalgias, back pain and joint pain.  Skin: Negative for itching and rash.  Neurological: Negative for dizziness, tremors, focal weakness, seizures and loss of consciousness.  Endo/Heme/Allergies: Negative for environmental allergies.  Psychiatric/Behavioral: Negative for depression, suicidal ideas and hallucinations.  All other systems reviewed and are negative.  Physical Exam: Blood pressure (!) 142/80, pulse 84, height 5' 2.5" (1.588 m), weight 160 lb  6.4 oz (72.8 kg), last menstrual period 02/21/1976, SpO2 98 %. Gen:      No acute distress HEENT:  EOMI, sclera anicteric Neck:     No masses; no thyromegaly Lungs:    Clear to auscultation bilaterally; normal respiratory effor CV:         Regular rate and rhythm; no murmurs Abd:      + bowel sounds; soft, non-tender; no palpable masses, no distension Ext:    No edema; adequate peripheral perfusion Skin:      Warm and dry; no rash Neuro: alert and oriented x 3 Psych: normal mood and affect  Data Reviewed: FENO 06/29/2017-54  Assessment:  Moderate persistent asthma Symptoms appear to  be under reasonable control with Advair.  However FENO is elevated and she is using the Advair only once a day.  She may have possible mold exposure from dampness in the basement Discussed optimal use of controller medication and have asked her to start using the Advair twice daily Continue albuterol rescue inhaler  Check CBC differential, blood allergy profile, mold antibody panel Schedule pulmonary function test  Allergic rhinitis, GERD Continue Zyrtec and Prilosec Use Nasonex over-the-counter  Plan/Recommendations: - Start using Advair twice daily, continue albuterol as needed - Zyrtec, Nasonex, Prilosec - Check CBC differential, blood allergy profile, mold antibody panel, PFTs  Marshell Garfinkel MD [facility] 06/26/2017, 11:15 AM  CC: Eileen Squibb, MD

## 2017-06-26 NOTE — Patient Instructions (Addendum)
We will check some blood test today including CBC differential and a blood allergy profile, mold antibody panel Please make sure that you are using your Advair twice daily Continue Prilosec for acid reflux You can use Zyrtec or Benadryl and nasonex over-the-counter for allergies We will schedule you for pulmonary function test Return to clinic after tests for review.

## 2017-06-27 LAB — RESPIRATORY ALLERGY PROFILE REGION II ~~LOC~~
ALLERGEN, COTTONWOOD, T14: 1.05 kU/L — AB
ALLERGEN, OAK, T7: 3.96 kU/L — AB
Allergen, A. alternata, m6: <0.1 kU/L
Allergen, Cedar tree, t12: 0.33 kU/L — ABNORMAL HIGH
Allergen, Comm Silver Birch, t9: 1.82 kU/L — ABNORMAL HIGH
Allergen, D pternoyssinus,d7: <0.1 kU/L
Allergen, Mulberry, t76: <0.1 kU/L
Allergen, P. notatum, m1: <0.1 kU/L
BERMUDA GRASS: 4.01 kU/L — AB
Box Elder IgE: 0.99 kU/L — ABNORMAL HIGH
CLADOSPORIUM HERBARUM (M2) IGE: <0.1 kU/L
CLASS: 0
CLASS: 0
CLASS: 0
CLASS: 0
CLASS: 0
CLASS: 0
CLASS: 0
CLASS: 2
CLASS: 2
CLASS: 2
CLASS: 2
CLASS: 2
CLASS: 3
CLASS: 3
CLASS: 3
COMMON RAGWEED (SHORT) (W1) IGE: 2.71 kU/L — ABNORMAL HIGH
Cat Dander: <0.1 kU/L
Class: 0
Class: 0
Class: 0
Class: 0
Class: 0
Class: 2
Class: 2
Class: 2
Class: 2
D. farinae: <0.1 kU/L
Dog Dander: <0.1 kU/L
Elm IgE: 1.39 kU/L — ABNORMAL HIGH
IgE (Immunoglobulin E), Serum: 81 kU/L (ref <=114)
Johnson Grass: 1.83 kU/L — ABNORMAL HIGH
Pecan/Hickory Tree IgE: 1.29 kU/L — ABNORMAL HIGH
ROUGH PIGWEED IGE: 1.03 kU/L — AB
SHEEP SORREL IGE: 1.53 kU/L — AB
TIMOTHY GRASS: 7.09 kU/L — AB

## 2017-06-27 LAB — ALLERGY PANEL 11, MOLD GROUP
Allergen, A. alternata, m6: <0.1 kU/L
Allergen, Mucor Racemosus, M4: <0.1 kU/L
Aspergillus fumigatus, m3: <0.1 kU/L
CLASS: 0
CLASS: 0
CLASS: 0
CLASS: 0
Class: 0

## 2017-06-27 LAB — INTERPRETATION:

## 2017-08-07 ENCOUNTER — Encounter: Payer: Self-pay | Admitting: Pulmonary Disease

## 2017-08-07 ENCOUNTER — Ambulatory Visit (INDEPENDENT_AMBULATORY_CARE_PROVIDER_SITE_OTHER): Payer: Medicare Other | Admitting: Pulmonary Disease

## 2017-08-07 ENCOUNTER — Ambulatory Visit: Payer: Medicare Other | Admitting: Pulmonary Disease

## 2017-08-07 DIAGNOSIS — J4530 Mild persistent asthma, uncomplicated: Secondary | ICD-10-CM | POA: Diagnosis not present

## 2017-08-07 LAB — PULMONARY FUNCTION TEST
DL/VA % pred: 113 %
DL/VA: 5.22 ml/min/mmHg/L
DLCO UNC % PRED: 90 %
DLCO UNC: 20.07 ml/min/mmHg
DLCO cor % pred: 91 %
DLCO cor: 20.39 ml/min/mmHg
FEF 25-75 PRE: 3.57 L/s
FEF 25-75 Post: 3.75 L/sec
FEF2575-%Change-Post: 5 %
FEF2575-%PRED-POST: 231 %
FEF2575-%Pred-Pre: 220 %
FEV1-%CHANGE-POST: 5 %
FEV1-%Pred-Post: 120 %
FEV1-%Pred-Pre: 114 %
FEV1-PRE: 1.97 L
FEV1-Post: 2.09 L
FEV1FVC-%Change-Post: -2 %
FEV1FVC-%PRED-PRE: 119 %
FEV6-%CHANGE-POST: 10 %
FEV6-%PRED-POST: 107 %
FEV6-%Pred-Pre: 97 %
FEV6-POST: 2.3 L
FEV6-Pre: 2.08 L
FEV6FVC-%PRED-POST: 104 %
FEV6FVC-%Pred-Pre: 104 %
FVC-%Change-Post: 8 %
FVC-%Pred-Post: 103 %
FVC-%Pred-Pre: 95 %
FVC-Post: 2.3 L
FVC-Pre: 2.12 L
PRE FEV6/FVC RATIO: 100 %
Post FEV1/FVC ratio: 91 %
Post FEV6/FVC ratio: 100 %
Pre FEV1/FVC ratio: 93 %
RV % PRED: 92 %
RV: 1.94 L
TLC % pred: 88 %
TLC: 4.26 L

## 2017-08-07 LAB — NITRIC OXIDE: NITRIC OXIDE: 42

## 2017-08-07 MED ORDER — MONTELUKAST SODIUM 10 MG PO TABS: 10.0000 mg | ORAL_TABLET | Freq: Every day | ORAL | 5 refills | Status: DC

## 2017-08-07 NOTE — Patient Instructions (Signed)
We will start you on Singulair 10 mg once a day You can use Zyrtec as needed Continue the Advair for now  I have reviewed your lung function test which are normal I will follow back with you 3 to 4 months.

## 2017-08-07 NOTE — Progress Notes (Signed)
PFT completed 08/07/17

## 2017-08-07 NOTE — Progress Notes (Signed)
Eileen Mccoy    [id number]    [birth year]  Primary Care Physician:Hall, Edwinna Areola, MD  Referring Physician: Celene Squibb, MD [address] [place], [place] [address]  Chief complaint: Follow-up for asthma  HPI: 67 year old with history of allergic rhinitis, sinusitis, GERD, asthma, hypertension She was diagnosed with asthma about 7 years ago and has been maintained on Advair 250.  She is using the Advair about once a day.  She is also on albuterol which he uses 1-2 times through the week.  Has occasional nocturnal awakenings. Has seasonal allergies, postnasal drip, allergic rhinitis for which she is using Zyrtec over-the-counter She also has acid reflux for which she is on Prilosec  Pets: Exposed to cat in her daughter's home.  Feels that she is allergic to it.  No birds, farm animals Occupation: Works as a Quarry manager.  Previously worked at a [employer] Exposures: Exposed to [employer] while in a [employer].  Has a damp basement and feels there may be mold in Smoking history: Never smoker Travel history: Lived in [place] all her life.  No significant travel Relevant family history: Mother had asthma and died from an exacerbation.  Interim history: She has started using advair 2 times daily and states that breathing is much better.  Hardly needs to use her rescue inhaler. Denies any cough, sputum production, nighttime awakenings.  Outpatient Encounter Medications as of 08/07/2017  Medication Sig  . acetaminophen (TYLENOL) 650 MG CR tablet Take 650 mg by mouth every 8 (eight) hours as needed for pain.  . Albuterol Sulfate 108 (90 Base) MCG/ACT AEPB Inhale 2 puffs into the lungs every 6 (six) hours as needed (shortness of breath).  . Calcium Carbonate (CALCIUM 600 PO) Take 600 mg by mouth daily.  . diphenhydrAMINE (BENADRYL) 25 MG tablet Take 25 mg by mouth every 6 (six) hours as needed for allergies.   . Fluticasone-Salmeterol (ADVAIR) 250-50 MCG/DOSE AEPB Inhale 1  puff into the lungs 2 (two) times daily.  . hydrochlorothiazide (HYDRODIURIL) 12.5 MG tablet Take 12.5 mg by mouth daily.  . Multiple Vitamin (MULTIVITAMIN) tablet Take 1 tablet by mouth daily.  . Naphazoline-Pheniramine (OPCON-A OP) Apply 1 drop to eye daily as needed (allergies).   Marland Kitchen omeprazole (PRILOSEC) 20 MG capsule Take 1 capsule (20 mg total) by mouth daily.  [name] Sodium (SENNA S PO) Take 1 tablet by mouth as needed (constipation).   . [DISCONTINUED] raloxifene (EVISTA) 60 MG tablet Take 60 mg by mouth daily.   No facility-administered encounter medications on file as of 08/07/2017.     Allergies as of 08/07/2017 - Review Complete 08/07/2017  Allergen Reaction Noted  . Codeine  02/23/2016    Past Medical History:  Diagnosis Date  . Asthma 02/23/2016  . GERD (gastroesophageal reflux disease)   . Hypertension   . Kidney stone 02/23/2016  . Thyroid disease     Past Surgical History:  Procedure Laterality Date  . ABDOMINAL HYSTERECTOMY    . BIOPSY  03/16/2016   Procedure: BIOPSY;  Surgeon: Rogene Houston, MD;  Location: [facility];  Service: Endoscopy;;  gastric biopsies  . ESOPHAGOGASTRODUODENOSCOPY N/A 03/16/2016   Procedure: ESOPHAGOGASTRODUODENOSCOPY (EGD);  Surgeon: Rogene Houston, MD;  Location: [facility];  Service: Endoscopy;  Laterality: N/A;  3:00  . PARTIAL HYSTERECTOMY     1998 for fibroids  . POLYPECTOMY  03/16/2016   Procedure: POLYPECTOMY;  Surgeon: Rogene Houston, MD;  Location: [facility];  Service: Endoscopy;;  gastric polypectomies  . THYROIDECTOMY, PARTIAL      Family History  Problem Relation Age of Onset  . Heart disease Mother   . Asthma Mother   . COPD Mother 66       COPD  . Cancer Father   . Alcohol abuse Father   . COPD Sister 65  . Alcohol abuse Brother   . Heart disease Sister 7    Social History   Socioeconomic History  . Marital status: Married    Spouse name: Hendricks Milo  . Number of children: 2  . Years of  education: 6  . Highest education level: Not on file  Occupational History  . Occupation: CNA  Social Needs  . Financial resource strain: Not on file  . Food insecurity:    Worry: Not on file    Inability: Not on file  . Transportation needs:    Medical: Not on file    Non-medical: Not on file  Tobacco Use  . Smoking status: Never Smoker  . Smokeless tobacco: Never Used  Substance and Sexual Activity  . Alcohol use: Yes    Comment: occasionally  . Drug use: No  . Sexual activity: Yes  Lifestyle  . Physical activity:    Days per week: 2 days    Minutes per session: 30 min  . Stress: Not on file  Relationships  . Social connections:    Talks on phone: Not on file    Gets together: Not on file    Attends religious service: Not on file    Active member of club or organization: Not on file    Attends meetings of clubs or organizations: Not on file    Relationship status: Not on file  . Intimate partner violence:    Fear of current or ex partner: Not on file    Emotionally abused: Not on file    Physically abused: Not on file    Forced sexual activity: Not on file  Other Topics Concern  . Not on file  Social History Narrative   Married to [place]   He is retired from [employer] is semi retired   2 daughters, two grandsons in [place]    Review of systems: Review of Systems  Constitutional: Negative for fever and chills.  HENT: Negative.   Eyes: Negative for blurred vision.  Respiratory: as per HPI  Cardiovascular: Negative for chest pain and palpitations.  Gastrointestinal: Negative for vomiting, diarrhea, blood per rectum. Genitourinary: Negative for dysuria, urgency, frequency and hematuria.  Musculoskeletal: Negative for myalgias, back pain and joint pain.  Skin: Negative for itching and rash.  Neurological: Negative for dizziness, tremors, focal weakness, seizures and loss of consciousness.  Endo/Heme/Allergies: Negative for environmental allergies.    Psychiatric/Behavioral: Negative for depression, suicidal ideas and hallucinations.  All other systems reviewed and are negative.  Physical Exam: Blood pressure 140/70, pulse 85, height 5\' 2"  (1.575 m), weight 162 lb (73.5 kg), last menstrual period 02/21/1976, SpO2 96 %. Gen:      No acute distress HEENT:  EOMI, sclera anicteric Neck:     No masses; no thyromegaly Lungs:    Clear to auscultation bilaterally; normal respiratory effort CV:         Regular rate and rhythm; no murmurs Abd:      + bowel sounds; soft, non-tender; no palpable masses, no distension Ext:    No edema; adequate peripheral perfusion Skin:  Warm and dry; no rash Neuro: alert and oriented x 3 Psych: normal mood and affect  Data Reviewed: FENO  06/29/2017-54 08/07/1988-42  PFTs 08/07/2017 FVC 2.30 [103%), FEV1 2.09 [120%), F/F 91, TLC 88%, DLCO 90% Normal test  CBC 06/26/2017-WBC 8.9, eos 2.4%, absolute eosinophil count Blood allergy profile 06/26/2017- IgE 81, RAST panel shows sensitivity to grass, tree pollen. Mold antibody panel 06/26/2017-negative  Assessment:  Moderate persistent asthma PFTs reviewed with no obstruction.  However symptoms appear to have improved with use of Advair.  We will continue the same Continue albuterol rescue inhaler.  Allergic rhinitis, GERD Continue Prilosec Use Nasonex over-the-counter Start Singulair for allergies. Zyrtec as needed.  Plan/Recommendations: - Continue Advair, albuterol as needed - Start Singulair.  Continue Prilosec, Nasonex.  Marshell Garfinkel MD [facility] 08/07/2017, 12:18 PM  CC: Celene Squibb, MD

## 2017-08-07 NOTE — Progress Notes (Signed)
Inhaler training given. In-check peak flow #:65 

## 2017-11-08 ENCOUNTER — Ambulatory Visit: Payer: Medicare Other | Admitting: Pulmonary Disease

## 2017-11-22 ENCOUNTER — Encounter: Payer: Medicare Other | Admitting: Family Medicine

## 2018-10-18 ENCOUNTER — Other Ambulatory Visit (HOSPITAL_COMMUNITY): Payer: Self-pay | Admitting: Adult Health Nurse Practitioner

## 2018-10-18 DIAGNOSIS — Z780 Asymptomatic menopausal state: Secondary | ICD-10-CM

## 2018-11-06 ENCOUNTER — Other Ambulatory Visit: Payer: Self-pay

## 2018-11-06 ENCOUNTER — Ambulatory Visit (HOSPITAL_COMMUNITY)
Admission: RE | Admit: 2018-11-06 | Discharge: 2018-11-06 | Disposition: A | Discharge status: Home / Self Care | Payer: Medicare Other | Source: Ambulatory Visit | Attending: Adult Health Nurse Practitioner | Admitting: Adult Health Nurse Practitioner

## 2018-11-06 DIAGNOSIS — Z780 Asymptomatic menopausal state: Secondary | ICD-10-CM | POA: Insufficient documentation

## 2019-01-01 ENCOUNTER — Other Ambulatory Visit: Payer: Self-pay | Admitting: Internal Medicine

## 2019-01-01 DIAGNOSIS — R921 Mammographic calcification found on diagnostic imaging of breast: Secondary | ICD-10-CM

## 2019-01-08 ENCOUNTER — Ambulatory Visit
Admission: RE | Admit: 2019-01-08 | Discharge: 2019-01-08 | Disposition: A | Discharge status: Home / Self Care | Payer: Medicare Other | Source: Ambulatory Visit | Attending: Internal Medicine | Admitting: Internal Medicine

## 2019-01-08 ENCOUNTER — Other Ambulatory Visit: Payer: Self-pay

## 2019-01-08 DIAGNOSIS — R921 Mammographic calcification found on diagnostic imaging of breast: Secondary | ICD-10-CM

## 2019-01-15 ENCOUNTER — Telehealth: Payer: Self-pay | Admitting: Oncology

## 2019-01-15 NOTE — Telephone Encounter (Signed)
Received a new patient breast referral from Dr. [name] for breast cancer. Eileen Mccoy has been cld and scheduled to see Eileen Mccoy on 12/1 at 4:30pm w/labs at 4pm. Pt aware to arrive 15 minutes early.

## 2019-01-20 ENCOUNTER — Other Ambulatory Visit: Payer: Self-pay

## 2019-01-20 DIAGNOSIS — C50919 Malignant neoplasm of unspecified site of unspecified female breast: Secondary | ICD-10-CM

## 2019-01-20 NOTE — Progress Notes (Signed)
[place]  [phone] Fax:([phone]     ID: Eileen Mccoy DOB: 1950/10/06  MR#: [id number]  [id number]  Patient Care Team: Eileen Squibb, MD as PCP - General (Internal Medicine) Eileen Garfinkel, MD as Consulting Physician (Pulmonary Disease) Eileen Mccoy, Eileen Dad, MD as Consulting Physician (Oncology) Eileen Houston, MD as Consulting Physician (Gastroenterology) Eileen Cruel, MD OTHER MD:  CHIEF COMPLAINT: Ductal carcinoma in situ right breast  CURRENT TREATMENT: Awaiting definitive surgery   HISTORY OF CURRENT ILLNESS: Eileen Mccoy had routine screening mammography at [facility] in [place] showing a possible abnormality in the right breast.  We do not have copies of those or subsequent studies, but from the radiology review at conference it appears that she has a 6 cm area of calcifications in the upper outer quadrant of the breast.  No axillary ultrasound was obtained.  2 biopsies were obtained 01/08/2019 with the clips 3.4 cm apart.   The pathology from this procedure [id number]) showed:  1. Right Breast, lateral, near 9 o'clock  - ductal carcinoma with necrosis and calcifications, intermediate to high grade  -  prognostic indicators significant for: estrogen receptor, 90% positive and progesterone receptor, 20% positive, both with strong staining intensity. 2. Right Breast, lower-outer, posterior and medial to #1   - ductal carcinoma with necrosis and calcifications, intermediate to high grade  - prognostic indicators significant for: estrogen receptor, 100% positive with strong staining intensity, and progesterone receptor, 0% negative.   The patient's subsequent history is as detailed below.   INTERVAL HISTORY: Eileen Mccoy was evaluated in the breast cancer clinic on 01/21/2019 with her daughter Eileen Mccoy participating by speaker phone.  The patient's case was also presented at the multidisciplinary breast cancer conference on  01/15/2019. At that time a preliminary plan was proposed: If she desires breast conserving surgery she will need an MRI.  Consider radiation and antiestrogens.  Of note, her most recent bone density screening took place on 11/06/2018 at [facility] This showed a T-score of -0.7, which is considered normal.   REVIEW OF SYSTEMS: There were no specific symptoms leading to the original mammogram, which was routinely scheduled.The patient denies unusual headaches, visual changes, nausea, vomiting, stiff neck, dizziness, or gait imbalance. There has been no cough, phlegm production, or pleurisy, no chest pain or pressure, and no change in bowel or bladder habits. The patient denies fever, rash, unexplained fatigue or unexplained weight loss.  She did have significant bruising in the right breast from the recent biopsy.  A detailed review of systems was otherwise entirely negative.   PAST MEDICAL HISTORY: Past Medical History:  Diagnosis Date   Asthma 02/23/2016   Ductal carcinoma in situ (DCIS) of right breast 01/21/2019   GERD (gastroesophageal reflux disease)    Hypertension    Kidney stone 02/23/2016   Thyroid disease     PAST SURGICAL HISTORY: Past Surgical History:  Procedure Laterality Date   ABDOMINAL HYSTERECTOMY     BIOPSY  03/16/2016   Procedure: BIOPSY;  Surgeon: Eileen Houston, MD;  [facility];  Service: Endoscopy;;  gastric biopsies   ESOPHAGOGASTRODUODENOSCOPY N/A 03/16/2016   Procedure: ESOPHAGOGASTRODUODENOSCOPY (EGD);  Surgeon: Eileen Houston, MD;  Location: [facility];  Service: Endoscopy;  Laterality: N/A;  3:00   PARTIAL HYSTERECTOMY     1998 for fibroids   POLYPECTOMY  03/16/2016   Procedure: POLYPECTOMY;  Surgeon: Eileen Houston, MD;  Location[facility];  Service: Endoscopy;;  gastric polypectomies  THYROIDECTOMY, PARTIAL      FAMILY HISTORY: Family History  Problem Relation Age of Onset   Heart disease Mother    Asthma Mother     COPD Mother 15       COPD   Cancer Father    Alcohol abuse Father    COPD Sister 29   Alcohol abuse Brother    Heart disease Sister 26   Patient's father was 90 years old when he died from alcoholism. Patient's mother died from myocardial infarction at age 41. The patient denies a family hx of breast or ovarian cancer. She has 12 siblings, 8 sisters and 4 brothers.  She is not aware of anyone in the family having breast cancer.  GYNECOLOGIC HISTORY:  Patient's last menstrual period was 02/21/1976 (approximate). Menarche: 68 years old Age at first live birth: 68 years old Golden P 2 HRT 2 years  Hysterectomy? Yes, in the 1980's BSO?  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  She also worked in a factory for many years.. Her husband Eileen Mccoy is retired from [employer]. Daughter Eileen Mccoy, age 66, works as a Radio producer in [place], [place]. Daughter Eileen Mccoy, Eileen Mccoy") age 46 also lives in [place] and works in a bank.  The patient has 2 grandchildren.  She attends a [employer].    ADVANCED DIRECTIVES: In the absence of any documents to the contrary the patient's husband is a healthcare power of attorney   HEALTH MAINTENANCE: Social History   Tobacco Use   Smoking status: Never Smoker   Smokeless tobacco: Never Used  Substance Use Topics   Alcohol use: Yes    Comment: occasionally   Drug use: No     Colonoscopy:   PAP: none on file, s/p hysterectomy  Bone density: 10/2018, -0.7   Allergies  Allergen Reactions   Codeine     Stomach ache, nausea    Current Outpatient Medications  Medication Sig Dispense Refill   acetaminophen (TYLENOL) 650 MG CR tablet Take 650 mg by mouth every 8 (eight) hours as needed for pain.     Albuterol Sulfate 108 (90 Base) MCG/ACT AEPB Inhale 2 puffs into the lungs every 6 (six) hours as needed (shortness of breath). 1 each 1   Cholecalciferol (VITAMIN D3) 25 MCG (1000 UT) CAPS Take by  mouth.     Fluticasone-Salmeterol (ADVAIR) 250-50 MCG/DOSE AEPB Inhale 1 puff into the lungs 2 (two) times daily. 3 each 3   hydrochlorothiazide (HYDRODIURIL) 12.5 MG tablet Take 25 mg by mouth daily.   3   losartan (COZAAR) 25 MG tablet Take 25 mg by mouth daily.     Multiple Vitamin (MULTIVITAMIN) tablet Take 1 tablet by mouth daily.     Naphazoline-Pheniramine (OPCON-A OP) Apply 1 drop to eye daily as needed (allergies).      omeprazole (PRILOSEC) 20 MG capsule Take 1 capsule (20 mg total) by mouth daily. 90 capsule 3   Sennosides-Docusate Sodium (SENNA S PO) Take 1 tablet by mouth as needed (constipation).      No current facility-administered medications for this visit.     OBJECTIVE: Middle-aged African-American woman in no acute distress  Vitals:   01/21/19 1606  BP: (!) 154/83  Pulse: 84  Resp: 18  Temp: 98.2 F (36.8 C)  SpO2: 99%     Body mass index is 28.8 kg/m.   Wt Readings from Last 3 Encounters:  01/21/19 160 lb (72.6 kg)  08/07/17 162  lb (73.5 kg)  06/26/17 160 lb 6.4 oz (72.8 kg)      ECOG FS:1 - Symptomatic but completely ambulatory  Sclerae unicteric, EOMs intact Wearing a mask No cervical or supraclavicular adenopathy Lungs no rales or rhonchi Heart regular rate and rhythm Abd soft, nontender, positive bowel sounds MSK no focal spinal tenderness, no upper extremity lymphedema Neuro: nonfocal, well oriented, appropriate affect Breasts: The right breast is status post recent biopsy.  There is a significant ecchymosis.  The left breast is unremarkable.  Both axillae are benign.   LAB RESULTS:  CMP     Component Value Date/Time   NA 140 01/21/2019 1540   K 4.2 01/21/2019 1540   CL 102 01/21/2019 1540   CO2 28 01/21/2019 1540   GLUCOSE 95 01/21/2019 1540   BUN 16 01/21/2019 1540   CREATININE 0.98 01/21/2019 1540   CALCIUM 9.0 01/21/2019 1540   PROT 7.4 01/21/2019 1540   ALBUMIN 4.1 01/21/2019 1540   AST 15 01/21/2019 1540   ALT 18  01/21/2019 1540   ALKPHOS 72 01/21/2019 1540   BILITOT 0.2 (L) 01/21/2019 1540   GFRNONAA 58 (L) 01/21/2019 1540   GFRAA >60 01/21/2019 1540    No results found for: TOTALPROTELP, ALBUMINELP, A1GS, A2GS, BETS, BETA2SER, GAMS, MSPIKE, SPEI  No results found for: KPAFRELGTCHN, LAMBDASER, KAPLAMBRATIO  Lab Results  Component Value Date   WBC 10.3 01/21/2019   NEUTROABS 5.6 01/21/2019   HGB 11.8 (L) 01/21/2019   HCT 35.4 (L) 01/21/2019   MCV 86.6 01/21/2019   PLT 367 01/21/2019   No results found for: LABCA2  No components found for: [id number]  No results for input(s): INR in the last 168 hours.  No results found for: LABCA2  No results found for: [id number]  No results found for: [id number]  No results found for: [id number]  No results found for: CA2729  No components found for: HGQUANT  No results found for: CEA1 / No results found for: CEA1   No results found for: AFPTUMOR  No results found for: CHROMOGRNA  No results found for: PSA1  Appointment on 01/21/2019  Component Date Value Ref Range Status   Sodium 01/21/2019 140  135 - 145 mmol/L Final   Potassium 01/21/2019 4.2  3.5 - 5.1 mmol/L Final   Chloride 01/21/2019 102  98 - 111 mmol/L Final   CO2 01/21/2019 28  22 - 32 mmol/L Final   Glucose, Bld 01/21/2019 95  70 - 99 mg/dL Final   BUN 01/21/2019 16  8 - 23 mg/dL Final   Creatinine 01/21/2019 0.98  0.44 - 1.00 mg/dL Final   Calcium 01/21/2019 9.0  8.9 - 10.3 mg/dL Final   Total Protein 01/21/2019 7.4  6.5 - 8.1 g/dL Final   Albumin 01/21/2019 4.1  3.5 - 5.0 g/dL Final   AST 01/21/2019 15  15 - 41 U/L Final   ALT 01/21/2019 18  0 - 44 U/L Final   Alkaline Phosphatase 01/21/2019 72  38 - 126 U/L Final   Total Bilirubin 01/21/2019 0.2* 0.3 - 1.2 mg/dL Final   GFR, Est Non Af Am 01/21/2019 58* >60 mL/min Final   GFR, Est AFR Am 01/21/2019 >60  >60 mL/min Final   Anion gap 01/21/2019 10  5 - 15 Final   Performed at [facility], [place] [address], [place], [place] [address]   WBC Count 01/21/2019 10.3  4.0 - 10.5 K/uL Final   RBC 01/21/2019 4.09  3.87 - 5.11  MIL/uL Final   Hemoglobin 01/21/2019 11.8* 12.0 - 15.0 g/dL Final   HCT 01/21/2019 35.4* 36.0 - 46.0 % Final   MCV 01/21/2019 86.6  80.0 - 100.0 fL Final   MCH 01/21/2019 28.9  26.0 - 34.0 pg Final   MCHC 01/21/2019 33.3  30.0 - 36.0 g/dL Final   RDW 01/21/2019 14.6  11.5 - 15.5 % Final   Platelet Count 01/21/2019 367  150 - 400 K/uL Final   nRBC 01/21/2019 0.0  0.0 - 0.2 % Final   Neutrophils Relative % 01/21/2019 54  % Final   Neutro Abs 01/21/2019 5.6  1.7 - 7.7 K/uL Final   Lymphocytes Relative 01/21/2019 38  % Final   Lymphs Abs 01/21/2019 3.9  0.7 - 4.0 K/uL Final   Monocytes Relative 01/21/2019 6  % Final   Monocytes Absolute 01/21/2019 0.6  0.1 - 1.0 K/uL Final   Eosinophils Relative 01/21/2019 1  % Final   Eosinophils Absolute 01/21/2019 0.1  0.0 - 0.5 K/uL Final   Basophils Relative 01/21/2019 1  % Final   Basophils Absolute 01/21/2019 0.1  0.0 - 0.1 K/uL Final   Immature Granulocytes 01/21/2019 0  % Final   Abs Immature Granulocytes 01/21/2019 0.03  0.00 - 0.07 K/uL Final   Performed at [facility], [address] [place], [place] [address]    (this displays the last labs from the last 3 days)  No results found for: TOTALPROTELP, ALBUMINELP, A1GS, A2GS, BETS, BETA2SER, GAMS, MSPIKE, SPEI (this displays SPEP labs)  No results found for: KPAFRELGTCHN, LAMBDASER, KAPLAMBRATIO (kappa/lambda light chains)  No results found for: HGBA, HGBA2QUANT, HGBFQUANT, HGBSQUAN (Hemoglobinopathy evaluation)   No results found for: LDH  No results found for: IRON, TIBC, IRONPCTSAT (Iron and TIBC)  No results found for: FERRITIN  Urinalysis No results found for: COLORURINE, APPEARANCEUR, LABSPEC, PHURINE, GLUCOSEU, HGBUR, BILIRUBINUR, KETONESUR, PROTEINUR, UROBILINOGEN, NITRITE,  LEUKOCYTESUR   STUDIES: Mm Clip Placement Right  Result Date: 01/08/2019 CLINICAL DATA:  Evaluate marker clip placement following stereotactic core needle biopsies of 2 locations in the right breast, the medial and lateral extents of calcifications. EXAM: DIAGNOSTIC RIGHT MAMMOGRAM POST STEREOTACTIC BIOPSY COMPARISON:  Previous exam(s). FINDINGS: Mammographic images were obtained following stereotactic guided biopsy of 2 areas of calcifications in the right breast. Both biopsy marking clips are in expected position at the site of biopsy. IMPRESSION: Appropriate positioning of the X shaped biopsy marking clip at the site of biopsy in the lateral right breast, projecting in the lower outer quadrant. Appropriate positioning of the coil shaped biopsy marking clip at the site of biopsy in the posterior, central to slightly lateral right breast. Final Assessment: Post Procedure Mammograms for Marker Placement Electronically Signed   By: Lajean Manes M.D.   On: 01/08/2019 11:24   Mm Rt Breast Bx W Loc Dev 1st Lesion Image Bx Spec Stereo Guide  Addendum Date: 01/09/2019   ADDENDUM REPORT: 01/09/2019 15:10 ADDENDUM: Pathology revealed INTERMEDIATE TO HIGH GRADE DUCTAL CARCINOMA IN SITU WITH NECROSIS AND CALCIFICATIONS of the RIGHT breast, lateral, near 9 o'clock. This was found to be concordant by Dr. Lajean Manes. Pathology revealed INTERMEDIATE TO HIGH GRADE DUCTAL CARCINOMA IN SITU WITH NECROSIS AND CALCIFICATIONS of the RIGHT breast, lower, outer quadrant, posterior and medial to #1. This was found to be concordant by Dr. Lajean Manes. Pathology results were discussed with the patient by telephone. The patient reported doing well after the biopsies with tenderness at the sites. Post biopsy instructions and  care were reviewed and questions were answered. The patient was encouraged to call [facility] for any additional concerns. Per patient request, surgical consultation has been  arranged with Dr. Alphonsa Overall at [facility] on January 10, 2019. Pathology results reported by Stacie Acres, RN on 01/09/2019. Electronically Signed   By: Lajean Manes M.D.   On: 01/09/2019 15:10   Result Date: 01/09/2019 CLINICAL DATA:  Patient presents for stereotactic core needle biopsy of the mediolateral extents of a 5.8 cm span of suspicious calcifications in the lateral right breast. EXAM: RIGHT BREAST STEREOTACTIC CORE NEEDLE BIOPSY: 2 BIOPSIES PERFORMED COMPARISON:  Previous exams. FINDINGS: The patient and I discussed the procedure of stereotactic-guided biopsy including benefits and alternatives. We discussed the high likelihood of a successful procedure. We discussed the risks of the procedure including infection, bleeding, tissue injury, clip migration, and inadequate sampling. Informed written consent was given. The usual time out protocol was performed immediately prior to the procedure. Lesion #1 Using sterile technique and 1% Lidocaine as local anesthetic, under stereotactic guidance, a 9 gauge vacuum assisted device was used to perform core needle biopsy of calcifications in the lateral aspect of the right breast using a superior approach. Specimen radiograph was performed showing calcifications for which biopsy was performed. Specimens with calcifications are identified for pathology. Lesion quadrant: Lower outer quadrant, near 9 o'clock. At the conclusion of the procedure, X shaped tissue marker clip was deployed into the biopsy cavity. Lesion #2 Using sterile technique and 1% Lidocaine as local anesthetic, under stereotactic guidance, a 9 gauge vacuum assisted device was used to perform core needle biopsy of calcifications in the lower outer quadrant right breast using a superior approach. Specimen radiograph was performed showing calcifications for which biopsy was performed. Specimens with calcifications are identified for pathology. Lesion quadrant: Lower outer quadrant At  the conclusion of the procedure, coil shaped tissue marker clip was deployed into the biopsy cavity. Follow-up 2-view mammogram was performed and dictated separately. IMPRESSION: Stereotactic-guided biopsy of the medial and lateral extents of right breast calcifications. No apparent complications. Electronically Signed: By: Lajean Manes M.D. On: 01/08/2019 11:14   Mm Rt Breast Bx W Loc Dev Ea Ad Lesion Img Bx Spec Stereo Guide  Addendum Date: 01/09/2019   ADDENDUM REPORT: 01/09/2019 15:10 ADDENDUM: Pathology revealed INTERMEDIATE TO HIGH GRADE DUCTAL CARCINOMA IN SITU WITH NECROSIS AND CALCIFICATIONS of the RIGHT breast, lateral, near 9 o'clock. This was found to be concordant by Dr. Lajean Manes. Pathology revealed INTERMEDIATE TO HIGH GRADE DUCTAL CARCINOMA IN SITU WITH NECROSIS AND CALCIFICATIONS of the RIGHT breast, lower, outer quadrant, posterior and medial to #1. This was found to be concordant by Dr. Lajean Manes. Pathology results were discussed with the patient by telephone. The patient reported doing well after the biopsies with tenderness at the sites. Post biopsy instructions and care were reviewed and questions were answered. The patient was encouraged to call [facility] for any additional concerns. Per patient request, surgical consultation has been arranged with Dr. Alphonsa Overall at [facility] on January 10, 2019. Pathology results reported by Stacie Acres, RN on 01/09/2019. Electronically Signed   By: Lajean Manes M.D.   On: 01/09/2019 15:10   Result Date: 01/09/2019 CLINICAL DATA:  Patient presents for stereotactic core needle biopsy of the mediolateral extents of a 5.8 cm span of suspicious calcifications in the lateral right breast. EXAM: RIGHT BREAST STEREOTACTIC CORE NEEDLE BIOPSY: 2 BIOPSIES PERFORMED  COMPARISON:  Previous exams. FINDINGS: The patient and I discussed the procedure of stereotactic-guided biopsy including benefits and  alternatives. We discussed the high likelihood of a successful procedure. We discussed the risks of the procedure including infection, bleeding, tissue injury, clip migration, and inadequate sampling. Informed written consent was given. The usual time out protocol was performed immediately prior to the procedure. Lesion #1 Using sterile technique and 1% Lidocaine as local anesthetic, under stereotactic guidance, a 9 gauge vacuum assisted device was used to perform core needle biopsy of calcifications in the lateral aspect of the right breast using a superior approach. Specimen radiograph was performed showing calcifications for which biopsy was performed. Specimens with calcifications are identified for pathology. Lesion quadrant: Lower outer quadrant, near 9 o'clock. At the conclusion of the procedure, X shaped tissue marker clip was deployed into the biopsy cavity. Lesion #2 Using sterile technique and 1% Lidocaine as local anesthetic, under stereotactic guidance, a 9 gauge vacuum assisted device was used to perform core needle biopsy of calcifications in the lower outer quadrant right breast using a superior approach. Specimen radiograph was performed showing calcifications for which biopsy was performed. Specimens with calcifications are identified for pathology. Lesion quadrant: Lower outer quadrant At the conclusion of the procedure, coil shaped tissue marker clip was deployed into the biopsy cavity. Follow-up 2-view mammogram was performed and dictated separately. IMPRESSION: Stereotactic-guided biopsy of the medial and lateral extents of right breast calcifications. No apparent complications. Electronically Signed: By: Lajean Manes M.D. On: 01/08/2019 11:14    ELIGIBLE FOR AVAILABLE RESEARCH PROTOCOL: no  ASSESSMENT: 68 y.o. [place], [place] woman status post right breast biopsy x2 on 01/08/2019, both biopsies showing ductal carcinoma in situ, grade 2 or 3, strongly estrogen progesterone receptor positive,  weakly to negative progesterone receptor  (1) definitive surgery pending  (2) adjuvant radiation to follow if patient opts for breast conserving surgery  (3) consider antiestrogens at the completion of local treatment  PLAN: I spent approximately 50 minutes face to face with Shannan with more than 50% of that time spent in counseling and coordination of care. Specifically we reviewed the biology of the patient's diagnosis and the specifics of her situation.  Bronwyn understands that in noninvasive ductal carcinoma, also called ductal carcinoma in situ ("DCIS") the breast cancer cells remain trapped in the ducts were they started. They cannot travel to a vital organ. For that reason these cancers in themselves are not life-threatening.  If the whole breast is removed then all the ducts are removed and since the cancer cells are trapped in the ducts, the cure rate with mastectomy for noninvasive breast cancer is approximately 99%.  If she had mastectomy most likely she would not need radiation or antiestrogens.    Nevertheless we generally recommend lumpectomy, because there is no survival advantage to mastectomy and because the cosmetic result is may be superior with breast conservation.  If the patient keeps her breast, there will be some risk of recurrence. The recurrence can only be in the same breast since, again, the cells are trapped in the ducts. There is no connection from one breast to the other. The risk of local recurrence is cut by more than half with radiation, which is standard in this situation.  In estrogen receptor positive cancers like Bellamy's, anti-estrogens can also be considered. They will further reduce the risk of recurrence by one half. In addition anti-estrogens will lower the risk of a new breast cancer developing in either breast, also by  one half. That risk otherwise approaches 1% per year.   Accordingly the overall plan is for surgery, followed by radiation,  then a discussion of anti-estrogens.  Because of the extent of calcifications, we will likely obtain a breast MRI preop to assess the feasibility of lumpectomy.  The patient may end up receiving radiation treatments in the evening, which would be of course much closer than coming all the way here on a daily basis for several weeks.  The patient and her daughter had many questions regarding antiestrogens and in general appear negatively disposed to antiestrogens.  I explained that in most cases like Geryl's the benefit in terms of local local recurrence risk from taking antiestrogens is small, perhaps a 5% risk reduction.  (For example if the risk of local recurrence after surgery only is 30%, adjuvant radiation should lower that to the 12% range, and then antiestrogens with lowered to about 6%).  The patient's daughter and the patient will be reading up on tamoxifen and anastrozole before they return to see me by which time they should be able to make a decision whether or not Karman will want to pursue the antiestrogen option.  Lilien has a good understanding of the overall plan, which was given to her in writing.  Knows the goal of treatment in her case is cure. She will call with any problems that may develop before her next visit here.   Eileen Cruel, MD   01/21/2019 4:28 PM Medical Oncology and Hematology [facility] [place], [place] [address] Tel. [phone]    Fax. [phone]   This document serves as a record of services personally performed by Lurline Del, MD. It was created on his behalf by Wilburn Mylar, a trained medical scribe. The creation of this record is based on the scribe's personal observations and the provider's statements to them.   I, Lurline Del MD, have reviewed the above documentation for accuracy and completeness, and I agree with the above.

## 2019-01-21 ENCOUNTER — Other Ambulatory Visit: Payer: Self-pay

## 2019-01-21 ENCOUNTER — Inpatient Hospital Stay: Payer: Medicare Other | Attending: Oncology | Admitting: Oncology

## 2019-01-21 ENCOUNTER — Inpatient Hospital Stay: Payer: Medicare Other

## 2019-01-21 ENCOUNTER — Encounter: Payer: Self-pay | Admitting: Oncology

## 2019-01-21 DIAGNOSIS — D0511 Intraductal carcinoma in situ of right breast: Secondary | ICD-10-CM | POA: Insufficient documentation

## 2019-01-21 DIAGNOSIS — Z170 Estrogen receptor positive status [ER+]: Secondary | ICD-10-CM | POA: Insufficient documentation

## 2019-01-21 DIAGNOSIS — C50919 Malignant neoplasm of unspecified site of unspecified female breast: Secondary | ICD-10-CM

## 2019-01-21 HISTORY — DX: Intraductal carcinoma in situ of right breast: D05.11

## 2019-01-21 LAB — CBC WITH DIFFERENTIAL (CANCER CENTER ONLY)
Abs Immature Granulocytes: 0.03 10*3/uL (ref 0.00–0.07)
Basophils Absolute: 0.1 10*3/uL (ref 0.0–0.1)
Basophils Relative: 1 %
Eosinophils Absolute: 0.1 10*3/uL (ref 0.0–0.5)
Eosinophils Relative: 1 %
HCT: 35.4 % — ABNORMAL LOW (ref 36.0–46.0)
Hemoglobin: 11.8 g/dL — ABNORMAL LOW (ref 12.0–15.0)
Immature Granulocytes: 0 %
Lymphocytes Relative: 38 %
Lymphs Abs: 3.9 10*3/uL (ref 0.7–4.0)
MCH: 28.9 pg (ref 26.0–34.0)
MCHC: 33.3 g/dL (ref 30.0–36.0)
MCV: 86.6 fL (ref 80.0–100.0)
Monocytes Absolute: 0.6 10*3/uL (ref 0.1–1.0)
Monocytes Relative: 6 %
Neutro Abs: 5.6 10*3/uL (ref 1.7–7.7)
Neutrophils Relative %: 54 %
Platelet Count: 367 10*3/uL (ref 150–400)
RBC: 4.09 MIL/uL (ref 3.87–5.11)
RDW: 14.6 % (ref 11.5–15.5)
WBC Count: 10.3 10*3/uL (ref 4.0–10.5)
nRBC: 0 % (ref 0.0–0.2)

## 2019-01-21 LAB — CMP (CANCER CENTER ONLY)
ALT: 18 U/L (ref 0–44)
AST: 15 U/L (ref 15–41)
Albumin: 4.1 g/dL (ref 3.5–5.0)
Alkaline Phosphatase: 72 U/L (ref 38–126)
Anion gap: 10 (ref 5–15)
BUN: 16 mg/dL (ref 8–23)
CO2: 28 mmol/L (ref 22–32)
Calcium: 9 mg/dL (ref 8.9–10.3)
Chloride: 102 mmol/L (ref 98–111)
Creatinine: 0.98 mg/dL (ref 0.44–1.00)
GFR, Est AFR Am: >60 mL/min (ref >60)
GFR, Estimated: 58 mL/min — ABNORMAL LOW (ref >60)
Glucose, Bld: 95 mg/dL (ref 70–99)
Potassium: 4.2 mmol/L (ref 3.5–5.1)
Sodium: 140 mmol/L (ref 135–145)
Total Bilirubin: 0.2 mg/dL — ABNORMAL LOW (ref 0.3–1.2)
Total Protein: 7.4 g/dL (ref 6.5–8.1)

## 2019-01-21 NOTE — Progress Notes (Signed)
Location of Breast Cancer: Right Breast  Histology per Pathology Report:  01/08/19 Diagnosis 1. Breast, right, needle core biopsy, lateral, near 9 o'clock - DUCTAL CARCINOMA IN SITU WITH NECROSIS AND CALCIFICATIONS. - SEE MICROSCOPIC DESCRIPTION.  Receptor Status: ER(95%), PR (20%)  2. Breast, right, needle core biopsy, lower, outer quadrant, posterior and medial to #1 - DUCTAL CARCINOMA IN SITU WITH NECROSIS AND CALCIFICATIONS. - SEE MICROSCOPIC DESCRIPTION  Receptor Status: ER(100%), PR (NEG)  Did patient present with symptoms or was this found on screening mammography?: It was discovered on a screening mammogram.   Past/Anticipated interventions by surgeon, if any: 01/10/19 Dr. [name]   Past/Anticipated interventions by medical oncology, if any:  01/21/19 Dr. Jana Hakim  (1) definitive surgery pending (2) adjuvant radiation to follow if patient opts for breast conserving surgery (3) consider antiestrogens at the completion of local treatment  Lymphedema issues, if any:  N/A  Pain issues, if any: She denies. She reports tenderness to her biopsy site.    SAFETY ISSUES:  Prior radiation? No  Pacemaker/ICD? No  Possible current pregnancy? No  Is the patient on methotrexate? No  Current Complaints / other details:      Eileen Mccoy, Stephani Police, RN 01/21/2019,11:28 AM

## 2019-01-22 ENCOUNTER — Encounter: Payer: Self-pay | Admitting: *Deleted

## 2019-01-22 ENCOUNTER — Ambulatory Visit
Admission: RE | Admit: 2019-01-22 | Discharge: 2019-01-22 | Disposition: A | Discharge status: Home / Self Care | Payer: Medicare Other | Source: Ambulatory Visit | Attending: Radiation Oncology | Admitting: Radiation Oncology

## 2019-01-22 ENCOUNTER — Other Ambulatory Visit: Payer: Self-pay

## 2019-01-22 ENCOUNTER — Encounter: Payer: Self-pay | Admitting: Radiation Oncology

## 2019-01-22 ENCOUNTER — Telehealth: Payer: Self-pay | Admitting: Oncology

## 2019-01-22 DIAGNOSIS — D0511 Intraductal carcinoma in situ of right breast: Secondary | ICD-10-CM

## 2019-01-22 HISTORY — DX: Other specified bacterial intestinal infections: A04.8

## 2019-01-22 NOTE — Telephone Encounter (Signed)
I talk with patient regarding schedule  

## 2019-01-22 NOTE — Progress Notes (Addendum)
Radiation Oncology         [phone] ________________________________  Initial outpatient Consultation by telephone as patient was unable to access MyChart video during pandemic precautions (daughter [name] was also present)  Name: Eileen Mccoy MRN: [id number]  Date: 01/22/2019  DOB: 1950/10/25  ZD:[id number], Edwinna Areola, MD  Alphonsa Overall, MD   REFERRING PHYSICIAN: Alphonsa Overall, MD  DIAGNOSIS:    ICD-10-CM   1. Ductal carcinoma in situ (DCIS) of right breast  D05.11    Stage 0 Right Breast LOQ Ductal Carcinoma In Situ, ER95%/100%, PR20%/0%, Intermediate to High Grade  CHIEF COMPLAINT: Here to discuss management of right breast DCIS  HISTORY OF PRESENT ILLNESS::Eileen Mccoy is a 68 y.o. female who presented with breast abnormality on the following imaging: screening mammogram at [facility].  Symptoms, if any, at that time, were: none.   Mammogram and subsequent imaging are not currently in our system.  Biopsy of right breast on date of 01/08/2019 showed two foci of DCIS with necrosis and calcifications.  ER status: positive; PR status positive in the lateral 9 o'clock core, but negative in the second core; Grade 2-3.  She met with Dr. Jana Hakim yesterday, 01/21/2019.  On review of systems, she denies pain but notes tenderness to her biopsy site. She tries to walk regularly, bake, spend time with grand kids, and read  Dr. [name] is trying to obtain her outside mammography to determine her surgical options.     She tries to walk regularly, bake, spend time with grand kids, and read.   PREVIOUS RADIATION THERAPY: No  PAST MEDICAL HISTORY:  has a past medical history of Asthma (02/23/2016), Ductal carcinoma in situ (DCIS) of right breast (01/21/2019), GERD (gastroesophageal reflux disease), H. pylori infection, Hypertension, Kidney stone (02/23/2016), and Thyroid disease.    PAST SURGICAL HISTORY: Past Surgical History:  Procedure Laterality Date   ABDOMINAL HYSTERECTOMY     BIOPSY   03/16/2016   Procedure: BIOPSY;  Surgeon: Rogene Houston, MD;  [facility];  Service: Endoscopy;;  gastric biopsies   ESOPHAGOGASTRODUODENOSCOPY N/A 03/16/2016   Procedure: ESOPHAGOGASTRODUODENOSCOPY (EGD);  Surgeon: Rogene Houston, MD;  Location: [facility];  Service: Endoscopy;  Laterality: N/A;  3:00   PARTIAL HYSTERECTOMY     1998 for fibroids   POLYPECTOMY  03/16/2016   Procedure: POLYPECTOMY;  Surgeon: Rogene Houston, MD;  [facility]: [facility];  Service: Endoscopy;;  gastric polypectomies   THYROIDECTOMY, PARTIAL      FAMILY HISTORY: family history includes Alcohol abuse in her brother and father; Asthma in her mother; COPD (age of onset: 5) in her sister; COPD (age of onset: 38) in her mother; Cancer in her father; Heart disease in her mother; Heart disease (age of onset: 29) in her sister.  SOCIAL HISTORY:  reports that she has never smoked. She has never used smokeless tobacco. She reports current alcohol use. She reports that she does not use drugs.  ALLERGIES: Codeine  MEDICATIONS:  Current Outpatient Medications  Medication Sig Dispense Refill   acetaminophen (TYLENOL) 650 MG CR tablet Take 650 mg by mouth every 8 (eight) hours as needed for pain.     Albuterol Sulfate 108 (90 Base) MCG/ACT AEPB Inhale 2 puffs into the lungs every 6 (six) hours as needed (shortness of breath). 1 each 1   Artificial Tear Solution (SOOTHE XP OP) Apply to eye. One drop to each eye every morning     Cholecalciferol (VITAMIN D3) 25 MCG (1000 UT) CAPS  Take by mouth.     Fluticasone-Salmeterol (ADVAIR) 250-50 MCG/DOSE AEPB Inhale 1 puff into the lungs 2 (two) times daily. 3 each 3   hydrochlorothiazide (HYDRODIURIL) 12.5 MG tablet Take 25 mg by mouth daily.   3   losartan (COZAAR) 25 MG tablet Take 25 mg by mouth daily.     Multiple Vitamin (MULTIVITAMIN) tablet Take 1 tablet by mouth daily.     omeprazole (PRILOSEC) 20 MG capsule Take 1 capsule (20 mg total) by mouth  daily. 90 capsule 3   Sennosides-Docusate Sodium (SENNA S PO) Take 1 tablet by mouth as needed (constipation).      No current facility-administered medications for this encounter.     REVIEW OF SYSTEMS: As above   PHYSICAL EXAM:  vitals were not taken for this visit.       LABORATORY DATA:  Lab Results  Component Value Date   WBC 10.3 01/21/2019   HGB 11.8 (L) 01/21/2019   HCT 35.4 (L) 01/21/2019   MCV 86.6 01/21/2019   PLT 367 01/21/2019   CMP     Component Value Date/Time   NA 140 01/21/2019 1540   K 4.2 01/21/2019 1540   CL 102 01/21/2019 1540   CO2 28 01/21/2019 1540   GLUCOSE 95 01/21/2019 1540   BUN 16 01/21/2019 1540   CREATININE 0.98 01/21/2019 1540   CALCIUM 9.0 01/21/2019 1540   PROT 7.4 01/21/2019 1540   ALBUMIN 4.1 01/21/2019 1540   AST 15 01/21/2019 1540   ALT 18 01/21/2019 1540   ALKPHOS 72 01/21/2019 1540   BILITOT 0.2 (L) 01/21/2019 1540   GFRNONAA 58 (L) 01/21/2019 1540   GFRAA >60 01/21/2019 1540         RADIOGRAPHY: Mm Clip Placement Right  Result Date: 01/08/2019 CLINICAL DATA:  Evaluate marker clip placement following stereotactic core needle biopsies of 2 locations in the right breast, the medial and lateral extents of calcifications. EXAM: DIAGNOSTIC RIGHT MAMMOGRAM POST STEREOTACTIC BIOPSY COMPARISON:  Previous exam(s). FINDINGS: Mammographic images were obtained following stereotactic guided biopsy of 2 areas of calcifications in the right breast. Both biopsy marking clips are in expected position at the site of biopsy. IMPRESSION: Appropriate positioning of the X shaped biopsy marking clip at the site of biopsy in the lateral right breast, projecting in the lower outer quadrant. Appropriate positioning of the coil shaped biopsy marking clip at the site of biopsy in the posterior, central to slightly lateral right breast. Final Assessment: Post Procedure Mammograms for Marker Placement Electronically Signed   By: Lajean Manes M.D.   On:  01/08/2019 11:24   Mm Rt Breast Bx W Loc Dev 1st Lesion Image Bx Spec Stereo Guide  Addendum Date: 01/09/2019   ADDENDUM REPORT: 01/09/2019 15:10 ADDENDUM: Pathology revealed INTERMEDIATE TO HIGH GRADE DUCTAL CARCINOMA IN SITU WITH NECROSIS AND CALCIFICATIONS of the RIGHT breast, lateral, near 9 o'clock. This was found to be concordant by Dr. Lajean Manes. Pathology revealed INTERMEDIATE TO HIGH GRADE DUCTAL CARCINOMA IN SITU WITH NECROSIS AND CALCIFICATIONS of the RIGHT breast, lower, outer quadrant, posterior and medial to #1. This was found to be concordant by Dr. Lajean Manes. Pathology results were discussed with the patient by telephone. The patient reported doing well after the biopsies with tenderness at the sites. Post biopsy instructions and care were reviewed and questions were answered. The patient was encouraged to call [facility] for any additional concerns. Per patient request, surgical consultation has been arranged with Dr. Shanon Brow  [name] at [facility] on January 10, 2019. Pathology results reported by Stacie Acres, RN on 01/09/2019. Electronically Signed   By: Lajean Manes M.D.   On: 01/09/2019 15:10   Result Date: 01/09/2019 CLINICAL DATA:  Patient presents for stereotactic core needle biopsy of the mediolateral extents of a 5.8 cm span of suspicious calcifications in the lateral right breast. EXAM: RIGHT BREAST STEREOTACTIC CORE NEEDLE BIOPSY: 2 BIOPSIES PERFORMED COMPARISON:  Previous exams. FINDINGS: The patient and I discussed the procedure of stereotactic-guided biopsy including benefits and alternatives. We discussed the high likelihood of a successful procedure. We discussed the risks of the procedure including infection, bleeding, tissue injury, clip migration, and inadequate sampling. Informed written consent was given. The usual time out protocol was performed immediately prior to the procedure. Lesion #1 Using sterile technique and 1%  Lidocaine as local anesthetic, under stereotactic guidance, a 9 gauge vacuum assisted device was used to perform core needle biopsy of calcifications in the lateral aspect of the right breast using a superior approach. Specimen radiograph was performed showing calcifications for which biopsy was performed. Specimens with calcifications are identified for pathology. Lesion quadrant: Lower outer quadrant, near 9 o'clock. At the conclusion of the procedure, X shaped tissue marker clip was deployed into the biopsy cavity. Lesion #2 Using sterile technique and 1% Lidocaine as local anesthetic, under stereotactic guidance, a 9 gauge vacuum assisted device was used to perform core needle biopsy of calcifications in the lower outer quadrant right breast using a superior approach. Specimen radiograph was performed showing calcifications for which biopsy was performed. Specimens with calcifications are identified for pathology. Lesion quadrant: Lower outer quadrant At the conclusion of the procedure, coil shaped tissue marker clip was deployed into the biopsy cavity. Follow-up 2-view mammogram was performed and dictated separately. IMPRESSION: Stereotactic-guided biopsy of the medial and lateral extents of right breast calcifications. No apparent complications. Electronically Signed: By: Lajean Manes M.D. On: 01/08/2019 11:14   Mm Rt Breast Bx W Loc Dev Ea Ad Lesion Img Bx Spec Stereo Guide  Addendum Date: 01/09/2019   ADDENDUM REPORT: 01/09/2019 15:10 ADDENDUM: Pathology revealed INTERMEDIATE TO HIGH GRADE DUCTAL CARCINOMA IN SITU WITH NECROSIS AND CALCIFICATIONS of the RIGHT breast, lateral, near 9 o'clock. This was found to be concordant by Dr. Lajean Manes. Pathology revealed INTERMEDIATE TO HIGH GRADE DUCTAL CARCINOMA IN SITU WITH NECROSIS AND CALCIFICATIONS of the RIGHT breast, lower, outer quadrant, posterior and medial to #1. This was found to be concordant by Dr. Lajean Manes. Pathology results were discussed  with the patient by telephone. The patient reported doing well after the biopsies with tenderness at the sites. Post biopsy instructions and care were reviewed and questions were answered. The patient was encouraged to call [facility] for any additional concerns. Per patient request, surgical consultation has been arranged with Dr. Alphonsa Overall at [facility] on January 10, 2019. Pathology results reported by Stacie Acres, RN on 01/09/2019. Electronically Signed   By: Lajean Manes M.D.   On: 01/09/2019 15:10   Result Date: 01/09/2019 CLINICAL DATA:  Patient presents for stereotactic core needle biopsy of the mediolateral extents of a 5.8 cm span of suspicious calcifications in the lateral right breast. EXAM: RIGHT BREAST STEREOTACTIC CORE NEEDLE BIOPSY: 2 BIOPSIES PERFORMED COMPARISON:  Previous exams. FINDINGS: The patient and I discussed the procedure of stereotactic-guided biopsy including benefits and alternatives. We discussed the high likelihood of a successful procedure. We discussed the risks of the  procedure including infection, bleeding, tissue injury, clip migration, and inadequate sampling. Informed written consent was given. The usual time out protocol was performed immediately prior to the procedure. Lesion #1 Using sterile technique and 1% Lidocaine as local anesthetic, under stereotactic guidance, a 9 gauge vacuum assisted device was used to perform core needle biopsy of calcifications in the lateral aspect of the right breast using a superior approach. Specimen radiograph was performed showing calcifications for which biopsy was performed. Specimens with calcifications are identified for pathology. Lesion quadrant: Lower outer quadrant, near 9 o'clock. At the conclusion of the procedure, X shaped tissue marker clip was deployed into the biopsy cavity. Lesion #2 Using sterile technique and 1% Lidocaine as local anesthetic, under stereotactic guidance, a 9  gauge vacuum assisted device was used to perform core needle biopsy of calcifications in the lower outer quadrant right breast using a superior approach. Specimen radiograph was performed showing calcifications for which biopsy was performed. Specimens with calcifications are identified for pathology. Lesion quadrant: Lower outer quadrant At the conclusion of the procedure, coil shaped tissue marker clip was deployed into the biopsy cavity. Follow-up 2-view mammogram was performed and dictated separately. IMPRESSION: Stereotactic-guided biopsy of the medial and lateral extents of right breast calcifications. No apparent complications. Electronically Signed: By: Lajean Manes M.D. On: 01/08/2019 11:14      IMPRESSION/PLAN: Right Breast DCIS   It was a pleasure meeting the patient today. We discussed the risks, benefits, and side effects of radiotherapy.  Surgical options have not yet been finalized but she is hopeful for breast conservation.  In the setting of breast conservation, I recommend radiotherapy to the right breast to reduce her risk of locoregional recurrence by 1/2.  We discussed that radiation would take approximately 4 weeks to complete and that I would give the patient a few weeks to heal following surgery before starting treatment planning.  We spoke about acute effects including skin irritation and fatigue as well as much less common late effects including internal organ injury or irritation. We spoke about the latest technology that is used to minimize the risk of late effects for patients undergoing radiotherapy to the breast or chest wall. No guarantees of treatment were given. The patient is enthusiastic about proceeding with treatment. I look forward to participating in the patient's care.  I will await her referral back to me for postoperative follow-up and eventual CT simulation/treatment planning.  She is also considering adjuvant antiestrogens as discussed with medical  oncology.  This encounter was provided by telemedicine platform by telephone as patient was unable to access MyChart video during pandemic precautions The patient has given verbal consent for this type of encounter and has been advised to only accept a meeting of this type in a secure network environment. The time spent during this encounter was 36 minutes. The attendants for this meeting include Eppie Gibson  and Thomasena Edis.  Her daughter [name] was also present. During the encounter, Eppie Gibson was located at [facility] Radiation Oncology Department.  Eileen Mccoy was located at home.     __________________________________________   Eppie Gibson, MD   This document serves as a record of services personally performed by Eppie Gibson, MD. It was created on her behalf by Wilburn Mylar, a trained medical scribe. The creation of this record is based on the scribe's personal observations and the provider's statements to them. This document has been checked and approved by the attending provider.

## 2019-01-24 ENCOUNTER — Encounter: Payer: Self-pay | Admitting: Radiation Oncology

## 2019-01-28 ENCOUNTER — Other Ambulatory Visit: Payer: Self-pay | Admitting: Surgery

## 2019-01-28 DIAGNOSIS — D0591 Unspecified type of carcinoma in situ of right breast: Secondary | ICD-10-CM

## 2019-01-31 ENCOUNTER — Ambulatory Visit
Admission: RE | Admit: 2019-01-31 | Discharge: 2019-01-31 | Disposition: A | Discharge status: Home / Self Care | Payer: Medicare Other | Source: Ambulatory Visit | Attending: Surgery | Admitting: Surgery

## 2019-01-31 ENCOUNTER — Other Ambulatory Visit: Payer: Self-pay

## 2019-01-31 DIAGNOSIS — D0591 Unspecified type of carcinoma in situ of right breast: Secondary | ICD-10-CM

## 2019-01-31 MED ORDER — GADOBUTROL 1 MMOL/ML IV SOLN
7.0000 mL | Freq: Once | INTRAVENOUS | Status: AC | PRN
  Administered 2019-01-31: 7 mL via INTRAVENOUS

## 2019-02-04 ENCOUNTER — Other Ambulatory Visit: Payer: Self-pay | Admitting: Surgery

## 2019-02-04 DIAGNOSIS — R9389 Abnormal findings on diagnostic imaging of other specified body structures: Secondary | ICD-10-CM

## 2019-02-11 ENCOUNTER — Other Ambulatory Visit (HOSPITAL_COMMUNITY): Payer: Self-pay | Admitting: Diagnostic Radiology

## 2019-02-11 ENCOUNTER — Ambulatory Visit
Admission: RE | Admit: 2019-02-11 | Discharge: 2019-02-11 | Disposition: A | Discharge status: Home / Self Care | Payer: Medicare Other | Source: Ambulatory Visit | Attending: Surgery | Admitting: Surgery

## 2019-02-11 ENCOUNTER — Other Ambulatory Visit: Payer: Self-pay

## 2019-02-11 DIAGNOSIS — R9389 Abnormal findings on diagnostic imaging of other specified body structures: Secondary | ICD-10-CM

## 2019-02-11 MED ORDER — GADOBUTROL 1 MMOL/ML IV SOLN
7.0000 mL | Freq: Once | INTRAVENOUS | Status: AC | PRN
  Administered 2019-02-11: 7 mL via INTRAVENOUS

## 2019-02-24 ENCOUNTER — Other Ambulatory Visit: Payer: Self-pay | Admitting: Surgery

## 2019-02-24 DIAGNOSIS — C50411 Malignant neoplasm of upper-outer quadrant of right female breast: Secondary | ICD-10-CM

## 2019-02-25 ENCOUNTER — Encounter: Payer: Self-pay | Admitting: *Deleted

## 2019-03-04 ENCOUNTER — Encounter (HOSPITAL_BASED_OUTPATIENT_CLINIC_OR_DEPARTMENT_OTHER): Payer: Self-pay | Admitting: Surgery

## 2019-03-04 ENCOUNTER — Other Ambulatory Visit: Payer: Self-pay

## 2019-03-06 ENCOUNTER — Encounter: Payer: Self-pay | Admitting: *Deleted

## 2019-03-07 ENCOUNTER — Other Ambulatory Visit: Payer: Self-pay

## 2019-03-07 ENCOUNTER — Other Ambulatory Visit (HOSPITAL_COMMUNITY)
Admission: RE | Admit: 2019-03-07 | Discharge: 2019-03-07 | Disposition: A | Discharge status: Home / Self Care | Payer: Medicare Other | Source: Ambulatory Visit | Attending: Surgery | Admitting: Surgery

## 2019-03-07 ENCOUNTER — Encounter (HOSPITAL_BASED_OUTPATIENT_CLINIC_OR_DEPARTMENT_OTHER)
Admission: RE | Admit: 2019-03-07 | Discharge: 2019-03-07 | Disposition: A | Discharge status: Home / Self Care | Payer: Medicare Other | Source: Ambulatory Visit | Attending: Surgery | Admitting: Surgery

## 2019-03-07 DIAGNOSIS — Z20822 Contact with and (suspected) exposure to covid-19: Secondary | ICD-10-CM | POA: Insufficient documentation

## 2019-03-07 DIAGNOSIS — C50911 Malignant neoplasm of unspecified site of right female breast: Secondary | ICD-10-CM | POA: Diagnosis not present

## 2019-03-07 DIAGNOSIS — Z01818 Encounter for other preprocedural examination: Secondary | ICD-10-CM | POA: Insufficient documentation

## 2019-03-07 LAB — BASIC METABOLIC PANEL
Anion gap: 12 (ref 5–15)
BUN: 12 mg/dL (ref 8–23)
CO2: 28 mmol/L (ref 22–32)
Calcium: 9.6 mg/dL (ref 8.9–10.3)
Chloride: 102 mmol/L (ref 98–111)
Creatinine, Ser: 0.71 mg/dL (ref 0.44–1.00)
GFR calc Af Amer: >60 mL/min (ref >60)
GFR calc non Af Amer: >60 mL/min (ref >60)
Glucose, Bld: 109 mg/dL — ABNORMAL HIGH (ref 70–99)
Potassium: 4.7 mmol/L (ref 3.5–5.1)
Sodium: 142 mmol/L (ref 135–145)

## 2019-03-07 MED ORDER — CHLORHEXIDINE GLUCONATE CLOTH 2 % EX PADS: 6.0000 | MEDICATED_PAD | Freq: Once | CUTANEOUS | Status: DC

## 2019-03-08 LAB — NOVEL CORONAVIRUS, NAA (HOSP ORDER, SEND-OUT TO REF LAB; TAT 18-24 HRS): SARS-CoV-2, NAA: NOT DETECTED

## 2019-03-10 NOTE — Anesthesia Preprocedure Evaluation (Addendum)
Anesthesia Evaluation  Patient identified by MRN, date of birth, ID band Patient awake    Reviewed: Allergy & Precautions, NPO status , Patient's Chart, lab work & pertinent test results  History of Anesthesia Complications Negative for: history of anesthetic complications  Airway Mallampati: II  TM Distance: >3 FB Neck ROM: Full    Dental no notable dental hx.    Pulmonary asthma ,    Pulmonary exam normal        Cardiovascular hypertension, Pt. on medications Normal cardiovascular exam     Neuro/Psych negative neurological ROS  negative psych ROS   GI/Hepatic Neg liver ROS, GERD  Controlled and Medicated,  Endo/Other  negative endocrine ROS  Renal/GU negative Renal ROS  negative genitourinary   Musculoskeletal negative musculoskeletal ROS (+)   Abdominal   Peds  Hematology negative hematology ROS (+)   Anesthesia Other Findings Day of surgery medications reviewed with patient.  Reproductive/Obstetrics negative OB ROS                            Anesthesia Physical Anesthesia Plan  ASA: II  Anesthesia Plan: General   Post-op Pain Management: GA combined w/ Regional for post-op pain   Induction: Intravenous  PONV Risk Score and Plan: 3 and Treatment may vary due to age or medical condition, Ondansetron and Dexamethasone  Airway Management Planned: LMA  Additional Equipment: None  Intra-op Plan:   Post-operative Plan: Extubation in OR  Informed Consent: I have reviewed the patients History and Physical, chart, labs and discussed the procedure including the risks, benefits and alternatives for the proposed anesthesia with the patient or authorized representative who has indicated his/her understanding and acceptance.     Dental advisory given  Plan Discussed with: CRNA  Anesthesia Plan Comments:        Anesthesia Quick Evaluation

## 2019-03-10 NOTE — H&P (Signed)
Eileen Mccoy  Location: [facility] Patient #: [id number] DOB: 1950-06-08 Married / Language: English / Race: Black or African American Female  History of Present Illness   The patient is a 69 year old female who presents with a complaint of breast cancer.  The PCP is Dr. Merlyn Albert  Her daughter, Eileen Mccoy, is with her.  [The Covid-19 virus has disrupted normal medical care in [place] and across the nation. We have sometimes had to alter normal surgical/medical care to limit this epidemic and we have explained these changes to the patient.]  She had mammograms in [place] at [place]. I do not have copies of those results. She underwent a right breast biopsy on 01/08/2019 ([id number]) which showed 1. 9 o'clock - DCIS with necrosis, 2. UOQ - DCIS. On the report by Dr. Alejandro Mulling, he talked about a 5.8 cm span of suspicious calcifications. She has no family history of breast cancer. She is on hormone therapy.  I discussed the options for breast cancer treatment with the patient. I discussed a multidisciplinary approach to the treatment of breast cancer, which includes medical oncology and radiation oncology. I discussed the surgical options of lumpectomy vs. mastectomy. If mastectomy, there is the possibility of reconstruction. I discussed the options of lymph node biopsy. The treatment plan depends on the pathologic staging of the tumor and the patient's personal wishes. The risks of surgery include, but are not limited to, bleeding, infection, the need for further surgery, and nerve injury. The patient has been given literature on the treatment of breast cancer.  Plan: 1. Medical and radiation oncology consultation - we will talk after the consultation, 2. Review breast films (extent of microca++ and does she need further imaging), 3. Consider right breast lumpectomy with 2 seed localization  Review of Systems as stated in this  history (HPI) or in the review of systems. Otherwise all other 12 point ROS are negative  Past Medical History: 1. GERD 2. HTN 3. History of hysterectomy - 1998 4. Right parathyroidectomy - 2000 Eileen Mccoy We have no office notes 5. History of H Pylori - 2018 - Eileen Mccoy 6. Asthma sees Dr. Vaughan Browner with [place] [facility]  Social History: Married She has two daughters: Eileen Mccoy, who is from [place], with her.  Past Surgical History Eileen Mccoy, [place]; 01/10/2019 1:42 PM) Breast Mass; Local Excision  Right. Hysterectomy (not due to cancer) - Partial  Thyroid Surgery   Diagnostic Studies History Eileen Mccoy, [place]; 01/10/2019 1:42 PM) Colonoscopy  5-10 years ago Mammogram  within last year Pap Smear  >5 years ago  Allergies Eileen Mccoy, [place]; 01/10/2019 1:43 PM) Codeine/Codeine Derivatives  Allergies Reconciled   Medication History Eileen Mccoy, CMA; 01/10/2019 1:44 PM) Advair Diskus (250-50MCG/DOSE Aero Pow Br Act, Inhalation) Active. Omeprazole (20MG  Capsule DR, Oral) Active. hydroCHLOROthiazide (25MG  Tablet, Oral) Active. Aspirin 81 (81MG  Tablet DR, Oral) Active. ProAir HFA (108 (90 Base)MCG/ACT Aerosol Soln, Inhalation) Active. Medications Reconciled  Social History Eileen Mccoy, [place]; 01/10/2019 1:42 PM) Alcohol use  Occasional alcohol use. Caffeine use  Coffee, Tea. No drug use  Tobacco use  Never smoker.  Family History Eileen Mccoy, [place]; 01/10/2019 1:42 PM) Alcohol Abuse  Brother, Father, Sister. Respiratory Condition  Father.  Pregnancy / Birth History Eileen Mccoy, [place]; 01/10/2019 1:42 PM) Age at menarche  24 years. Age of menopause  64-50 Contraceptive History  Oral contraceptives. Gravida  3 Maternal age  3-20 Para  2  Other Problems Eileen Mccoy, [place]; 01/10/2019 1:42 PM) Asthma  Breast Cancer  Gastroesophageal Reflux Disease  High blood pressure  Other disease, cancer, significant illness     Review of  Systems Eileen Mccoy CMA; 01/10/2019 1:42 PM) General Not Present- Appetite Loss, Chills, Fatigue, Fever, Night Sweats, Weight Gain and Weight Loss. Skin Not Present- Change in Wart/Mole, Dryness, Hives, Jaundice, New Lesions, Non-Healing Wounds, Rash and Ulcer. HEENT Present- Seasonal Allergies and Wears glasses/contact lenses. Not Present- Earache, Hearing Loss, Hoarseness, Nose Bleed, Oral Ulcers, Ringing in the Ears, Sinus Pain, Sore Throat, Visual Disturbances and Yellow Eyes. Respiratory Not Present- Bloody sputum, Chronic Cough, Difficulty Breathing, Snoring and Wheezing. Breast Present- Breast Pain. Not Present- Breast Mass, Nipple Discharge and Skin Changes. Cardiovascular Not Present- Chest Pain, Difficulty Breathing Lying Down, Leg Cramps, Palpitations, Rapid Heart Rate, Shortness of Breath and Swelling of Extremities. Gastrointestinal Not Present- Abdominal Pain, Bloating, Bloody Stool, Change in Bowel Habits, Chronic diarrhea, Constipation, Difficulty Swallowing, Excessive gas, Gets full quickly at meals, Hemorrhoids, Indigestion, Nausea, Rectal Pain and Vomiting. Female Genitourinary Not Present- Frequency, Nocturia, Painful Urination, Pelvic Pain and Urgency. Musculoskeletal Not Present- Back Pain, Joint Pain, Joint Stiffness, Muscle Pain, Muscle Weakness and Swelling of Extremities. Neurological Not Present- Decreased Memory, Fainting, Headaches, Numbness, Seizures, Tingling, Tremor, Trouble walking and Weakness. Psychiatric Not Present- Anxiety, Bipolar, Change in Sleep Pattern, Depression, Fearful and Frequent crying. Endocrine Present- Hot flashes. Not Present- Cold Intolerance, Excessive Hunger, Hair Changes, Heat Intolerance and New Diabetes. Hematology Not Present- Blood Thinners, Easy Bruising, Excessive bleeding, Gland problems, HIV and Persistent Infections.  Vitals Eileen Mccoy CMA; 01/10/2019 1:45 PM) 01/10/2019 1:44 PM Weight: 158.6 lb Height: 62in Body Surface Area:  1.73 m Body Mass Index: 29.01 kg/m  Temp.: 97.55F  Pulse: 84 (Regular)  BP: 140/80 (Sitting, Left Arm, Standard)   Physical Exam  General: WN older AA F who is alert and generally healthy appearing. She is wearing a mask. HEENT: Normal. Pupils equal.  Neck: Supple. No mass. No thyroid mass. Lymph Nodes: No supraclavicular or cervical nodes.  Lungs: Clear to auscultation and symmetric breath sounds. Heart: RRR. No murmur or rub.  Breasts: Right - she has two areas with Steristrips in the UOQ of the right breast. She has a bruise in the LOQ of the right breast. I do not feel a specific mass. Left - no mass or nodule.  Abdomen: Soft. No mass. No tenderness. No hernia. Normal bowel sounds. No abdominal scars. Rectal: Not done.  Extremities: Good strength and ROM in upper and lower extremities.  Neurologic: Grossly intact to motor and sensory function. Psychiatric: Has normal mood and affect. Behavior is normal.  Assessment & Plan  1.  BREAST CANCER, STAGE 0, RIGHT (D05.91)  Story: Right breast biopsy on 01/08/2019 ([id number]) which showed 1. 9 o'clock - DCIS with necrosis, 2. UOQ - DCIS  Plan:  1. Medical and radiation oncology consultation - we will talk after the consultation   It looks like she saw/spoke to Dr. Jana Hakim (01/21/2019) and Dr. Isidore Moos (01/22/2019)  2.  Breast MRI   Addendum Note(Rachelann Enloe H. Eileen Gaskins MD; 02/03/2019 3:18 PM)   MRI IMPRESSION:    Patchy nonmass enhancement in the right breast as described above, some of which corresponds to the two sites biopsied under stereotactic guidance showing ductal carcinoma in situ. Although enhancement is patchy and discontinuous, it does extend more anteriorly and inferiorly than do the microcalcifications. In total, patchy enhancement is estimated to be 4.5 x 6.0 x 5.0 cm.    No specific MRI evidence of malignancy in  the left breast.    RECOMMENDATION:    Treatment planning for known right breast ductal carcinoma in  situ. If breast conservation is desired, then an attempt at MRI-guided biopsy of the anterior aspect of the patchy nonmass enhancement is suggested.   3,  Right mastectomy with right axillary SLNBx   Addendum Note(Ta Fair H. Eileen Gaskins MD; 02/24/2019 3:46 PM) I had a long talked with Ms. Chirico by phone. Her husband, Hendricks Milo, was on the phone. And we got a 3 way call with her daughter, Eileen Mccoy [phone]) I outlined the options of lumpectomy vs. mastectomy. But I think that the DCIS covers such a large area, she is best served with a mastectomy. We talked about reconstruction, but she is not ready for that at this time.  She has agreed to the mastectomy. I will do a sentinel node at the same time. Will proceed with surgery.  2. GERD 3. HTN 4. Right parathyroidectomy - 2000 Eileen Mccoy We have no office notes 5. History of H Pylori - 2018 - Eileen Mccoy 6. Asthma sees Dr. Vaughan Browner with [place] [facility]   Alphonsa Overall, MD, [facility] Office phone:  [phone]

## 2019-03-11 ENCOUNTER — Observation Stay (HOSPITAL_BASED_OUTPATIENT_CLINIC_OR_DEPARTMENT_OTHER)
Admission: RE | Admit: 2019-03-11 | Discharge: 2019-03-12 | Disposition: A | Discharge status: Home / Self Care | Payer: Medicare Other | Attending: Surgery | Admitting: Surgery

## 2019-03-11 ENCOUNTER — Ambulatory Visit (HOSPITAL_BASED_OUTPATIENT_CLINIC_OR_DEPARTMENT_OTHER): Payer: Medicare Other | Admitting: Anesthesiology

## 2019-03-11 ENCOUNTER — Ambulatory Visit (HOSPITAL_COMMUNITY)
Admission: RE | Admit: 2019-03-11 | Discharge: 2019-03-11 | Disposition: A | Discharge status: Home / Self Care | Payer: Medicare Other | Source: Ambulatory Visit | Attending: Surgery | Admitting: Surgery

## 2019-03-11 ENCOUNTER — Encounter (HOSPITAL_BASED_OUTPATIENT_CLINIC_OR_DEPARTMENT_OTHER): Payer: Self-pay | Admitting: Surgery

## 2019-03-11 ENCOUNTER — Other Ambulatory Visit: Payer: Self-pay

## 2019-03-11 ENCOUNTER — Encounter (HOSPITAL_BASED_OUTPATIENT_CLINIC_OR_DEPARTMENT_OTHER)
Admission: RE | Disposition: A | Discharge status: Home / Self Care | Payer: Self-pay | Source: Home / Self Care | Attending: Surgery

## 2019-03-11 DIAGNOSIS — D0511 Intraductal carcinoma in situ of right breast: Principal | ICD-10-CM | POA: Insufficient documentation

## 2019-03-11 DIAGNOSIS — D0591 Unspecified type of carcinoma in situ of right breast: Secondary | ICD-10-CM | POA: Diagnosis present

## 2019-03-11 DIAGNOSIS — J45909 Unspecified asthma, uncomplicated: Secondary | ICD-10-CM | POA: Insufficient documentation

## 2019-03-11 DIAGNOSIS — Z90710 Acquired absence of both cervix and uterus: Secondary | ICD-10-CM | POA: Diagnosis not present

## 2019-03-11 DIAGNOSIS — Z7982 Long term (current) use of aspirin: Secondary | ICD-10-CM | POA: Insufficient documentation

## 2019-03-11 DIAGNOSIS — Z885 Allergy status to narcotic agent status: Secondary | ICD-10-CM | POA: Diagnosis not present

## 2019-03-11 DIAGNOSIS — N644 Mastodynia: Secondary | ICD-10-CM | POA: Diagnosis not present

## 2019-03-11 DIAGNOSIS — K219 Gastro-esophageal reflux disease without esophagitis: Secondary | ICD-10-CM | POA: Insufficient documentation

## 2019-03-11 DIAGNOSIS — Z79899 Other long term (current) drug therapy: Secondary | ICD-10-CM | POA: Diagnosis not present

## 2019-03-11 DIAGNOSIS — I10 Essential (primary) hypertension: Secondary | ICD-10-CM | POA: Insufficient documentation

## 2019-03-11 DIAGNOSIS — Z7951 Long term (current) use of inhaled steroids: Secondary | ICD-10-CM | POA: Diagnosis not present

## 2019-03-11 DIAGNOSIS — E892 Postprocedural hypoparathyroidism: Secondary | ICD-10-CM | POA: Diagnosis not present

## 2019-03-11 DIAGNOSIS — Z836 Family history of other diseases of the respiratory system: Secondary | ICD-10-CM | POA: Diagnosis not present

## 2019-03-11 DIAGNOSIS — Z8619 Personal history of other infectious and parasitic diseases: Secondary | ICD-10-CM | POA: Insufficient documentation

## 2019-03-11 DIAGNOSIS — C50411 Malignant neoplasm of upper-outer quadrant of right female breast: Secondary | ICD-10-CM | POA: Diagnosis not present

## 2019-03-11 DIAGNOSIS — Z811 Family history of alcohol abuse and dependence: Secondary | ICD-10-CM | POA: Diagnosis not present

## 2019-03-11 HISTORY — PX: MASTECTOMY W/ SENTINEL NODE BIOPSY: SHX2001

## 2019-03-11 SURGERY — MASTECTOMY WITH SENTINEL LYMPH NODE BIOPSY
Anesthesia: General | Site: Breast | Laterality: Right

## 2019-03-11 MED ORDER — ONDANSETRON 4 MG PO TBDP: 4.0000 mg | ORAL_TABLET | Freq: Four times a day (QID) | ORAL | Status: DC | PRN

## 2019-03-11 MED ORDER — MORPHINE SULFATE (PF) 4 MG/ML IV SOLN: 1.0000 mg | INTRAVENOUS | Status: DC | PRN

## 2019-03-11 MED ORDER — ALBUTEROL SULFATE 108 (90 BASE) MCG/ACT IN AEPB: 2.0000 | INHALATION_SPRAY | Freq: Four times a day (QID) | RESPIRATORY_TRACT | Status: DC | PRN

## 2019-03-11 MED ORDER — TRAMADOL HCL 50 MG PO TABS
50.0000 mg | ORAL_TABLET | Freq: Four times a day (QID) | ORAL | Status: DC | PRN
  Administered 2019-03-11 (×2): 50 mg via ORAL
  Filled 2019-03-11 (×2): qty 1

## 2019-03-11 MED ORDER — CEFAZOLIN SODIUM-DEXTROSE 2-4 GM/100ML-% IV SOLN
INTRAVENOUS | Status: AC
  Filled 2019-03-11: qty 100

## 2019-03-11 MED ORDER — ACETAMINOPHEN 500 MG PO TABS: 1000.0000 mg | ORAL_TABLET | ORAL | Status: DC

## 2019-03-11 MED ORDER — LIDOCAINE 2% (20 MG/ML) 5 ML SYRINGE
INTRAMUSCULAR | Status: AC
  Filled 2019-03-11: qty 5

## 2019-03-11 MED ORDER — ONDANSETRON HCL 4 MG/2ML IJ SOLN
INTRAMUSCULAR | Status: DC | PRN
  Administered 2019-03-11: 4 mg via INTRAVENOUS

## 2019-03-11 MED ORDER — TECHNETIUM TC 99M SULFUR COLLOID FILTERED
1.0000 | Freq: Once | INTRAVENOUS | Status: AC | PRN
  Administered 2019-03-11: 1 via INTRADERMAL

## 2019-03-11 MED ORDER — ENOXAPARIN SODIUM 40 MG/0.4ML ~~LOC~~ SOLN: 40.0000 mg | SUBCUTANEOUS | Status: DC

## 2019-03-11 MED ORDER — BUPIVACAINE HCL (PF) 0.25 % IJ SOLN
INTRAMUSCULAR | Status: AC
  Filled 2019-03-11: qty 60

## 2019-03-11 MED ORDER — HYDROCHLOROTHIAZIDE 25 MG PO TABS: 25.0000 mg | ORAL_TABLET | Freq: Every day | ORAL | Status: DC

## 2019-03-11 MED ORDER — ACETAMINOPHEN 500 MG PO TABS
1000.0000 mg | ORAL_TABLET | Freq: Once | ORAL | Status: AC
  Administered 2019-03-11: 07:00:00 1000 mg via ORAL

## 2019-03-11 MED ORDER — SODIUM CHLORIDE 0.9% FLUSH: 3.0000 mL | INTRAVENOUS | Status: DC | PRN

## 2019-03-11 MED ORDER — ONDANSETRON HCL 4 MG/2ML IJ SOLN: 4.0000 mg | Freq: Four times a day (QID) | INTRAMUSCULAR | Status: DC | PRN

## 2019-03-11 MED ORDER — LIDOCAINE 2% (20 MG/ML) 5 ML SYRINGE
INTRAMUSCULAR | Status: DC | PRN
  Administered 2019-03-11: 80 mg via INTRAVENOUS

## 2019-03-11 MED ORDER — PROPOFOL 10 MG/ML IV BOLUS
INTRAVENOUS | Status: DC | PRN
  Administered 2019-03-11: 140 mg via INTRAVENOUS

## 2019-03-11 MED ORDER — LOSARTAN POTASSIUM 25 MG PO TABS: 25.0000 mg | ORAL_TABLET | Freq: Every day | ORAL | Status: DC

## 2019-03-11 MED ORDER — METHYLENE BLUE 0.5 % INJ SOLN
INTRAVENOUS | Status: AC
  Filled 2019-03-11: qty 10

## 2019-03-11 MED ORDER — SODIUM CHLORIDE 0.9 % IV SOLN: 250.0000 mL | INTRAVENOUS | Status: DC | PRN

## 2019-03-11 MED ORDER — PROMETHAZINE HCL 25 MG/ML IJ SOLN
6.2500 mg | INTRAMUSCULAR | Status: DC | PRN
  Administered 2019-03-11: 11:00:00 6.25 mg via INTRAVENOUS

## 2019-03-11 MED ORDER — SODIUM CHLORIDE 0.9% FLUSH
3.0000 mL | Freq: Two times a day (BID) | INTRAVENOUS | Status: DC
  Administered 2019-03-11: 23:00:00 3 mL via INTRAVENOUS

## 2019-03-11 MED ORDER — FENTANYL CITRATE (PF) 100 MCG/2ML IJ SOLN
INTRAMUSCULAR | Status: DC | PRN
  Administered 2019-03-11: 25 ug via INTRAVENOUS

## 2019-03-11 MED ORDER — DEXAMETHASONE SODIUM PHOSPHATE 10 MG/ML IJ SOLN
INTRAMUSCULAR | Status: AC
  Filled 2019-03-11: qty 1

## 2019-03-11 MED ORDER — FENTANYL CITRATE (PF) 100 MCG/2ML IJ SOLN
INTRAMUSCULAR | Status: AC
  Filled 2019-03-11: qty 2

## 2019-03-11 MED ORDER — SODIUM CHLORIDE (PF) 0.9 % IJ SOLN
INTRAMUSCULAR | Status: AC
  Filled 2019-03-11: qty 10

## 2019-03-11 MED ORDER — MIDAZOLAM HCL 2 MG/2ML IJ SOLN: 1.0000 mg | INTRAMUSCULAR | Status: DC | PRN

## 2019-03-11 MED ORDER — BUPIVACAINE-EPINEPHRINE (PF) 0.25% -1:200000 IJ SOLN
INTRAMUSCULAR | Status: DC | PRN
  Administered 2019-03-11: 20 mg via PERINEURAL

## 2019-03-11 MED ORDER — MIDAZOLAM HCL 2 MG/2ML IJ SOLN
INTRAMUSCULAR | Status: AC
  Filled 2019-03-11: qty 2

## 2019-03-11 MED ORDER — KCL IN DEXTROSE-NACL 20-5-0.45 MEQ/L-%-% IV SOLN
INTRAVENOUS | Status: DC
  Filled 2019-03-11: qty 1000

## 2019-03-11 MED ORDER — BUPIVACAINE LIPOSOME 1.3 % IJ SUSP
INTRAMUSCULAR | Status: DC | PRN
  Administered 2019-03-11: 10 mg via PERINEURAL

## 2019-03-11 MED ORDER — DEXAMETHASONE SODIUM PHOSPHATE 10 MG/ML IJ SOLN
INTRAMUSCULAR | Status: DC | PRN
  Administered 2019-03-11: 4 mg via INTRAVENOUS

## 2019-03-11 MED ORDER — LACTATED RINGERS IV SOLN: INTRAVENOUS | Status: DC

## 2019-03-11 MED ORDER — PROMETHAZINE HCL 25 MG/ML IJ SOLN
INTRAMUSCULAR | Status: AC
  Filled 2019-03-11: qty 1

## 2019-03-11 MED ORDER — ACETAMINOPHEN 325 MG PO TABS
650.0000 mg | ORAL_TABLET | Freq: Three times a day (TID) | ORAL | Status: DC
  Administered 2019-03-11 – 2019-03-12 (×3): 650 mg via ORAL
  Filled 2019-03-11 (×3): qty 2

## 2019-03-11 MED ORDER — MOMETASONE FURO-FORMOTEROL FUM 200-5 MCG/ACT IN AERO
2.0000 | INHALATION_SPRAY | Freq: Two times a day (BID) | RESPIRATORY_TRACT | Status: DC
  Administered 2019-03-11 – 2019-03-12 (×2): 2 via RESPIRATORY_TRACT

## 2019-03-11 MED ORDER — CEFAZOLIN SODIUM-DEXTROSE 2-4 GM/100ML-% IV SOLN
2.0000 g | INTRAVENOUS | Status: AC
  Administered 2019-03-11: 2 g via INTRAVENOUS

## 2019-03-11 MED ORDER — ACETAMINOPHEN 500 MG PO TABS
ORAL_TABLET | ORAL | Status: AC
  Filled 2019-03-11: qty 2

## 2019-03-11 MED ORDER — PROPOFOL 500 MG/50ML IV EMUL
INTRAVENOUS | Status: AC
  Filled 2019-03-11: qty 50

## 2019-03-11 MED ORDER — HYDROCODONE-ACETAMINOPHEN 5-325 MG PO TABS: 1.0000 | ORAL_TABLET | ORAL | Status: DC | PRN

## 2019-03-11 MED ORDER — FENTANYL CITRATE (PF) 100 MCG/2ML IJ SOLN
50.0000 ug | INTRAMUSCULAR | Status: AC | PRN
  Administered 2019-03-11 (×3): 50 ug via INTRAVENOUS

## 2019-03-11 MED ORDER — ONDANSETRON HCL 4 MG/2ML IJ SOLN
INTRAMUSCULAR | Status: AC
  Filled 2019-03-11: qty 2

## 2019-03-11 MED ORDER — FENTANYL CITRATE (PF) 100 MCG/2ML IJ SOLN: 25.0000 ug | INTRAMUSCULAR | Status: DC | PRN

## 2019-03-11 SURGICAL SUPPLY — 74 items
APPLIER CLIP 11 MED OPEN (CLIP)
APPLIER CLIP 9.375 MED OPEN (MISCELLANEOUS)
BENZOIN TINCTURE PRP APPL 2/3 (GAUZE/BANDAGES/DRESSINGS) IMPLANT
BINDER BREAST LRG (GAUZE/BANDAGES/DRESSINGS) IMPLANT
BINDER BREAST MEDIUM (GAUZE/BANDAGES/DRESSINGS) IMPLANT
BINDER BREAST XLRG (GAUZE/BANDAGES/DRESSINGS) ×2 IMPLANT
BINDER BREAST XXLRG (GAUZE/BANDAGES/DRESSINGS) IMPLANT
BIOPATCH RED 1 DISK 7.0 (GAUZE/BANDAGES/DRESSINGS) ×3 IMPLANT
BIOPATCH RED 1IN DISK 7.0MM (GAUZE/BANDAGES/DRESSINGS) ×2
BLADE CLIPPER SURG (BLADE) IMPLANT
BLADE SURG 10 STRL SS (BLADE) ×3 IMPLANT
BLADE SURG 15 STRL LF DISP TIS (BLADE) ×1 IMPLANT
BLADE SURG 15 STRL SS (BLADE) ×2
BNDG ELASTIC 6X5.8 VLCR STR LF (GAUZE/BANDAGES/DRESSINGS) IMPLANT
CANISTER SUCT 1200ML W/VALVE (MISCELLANEOUS) ×3 IMPLANT
CHLORAPREP W/TINT 26 (MISCELLANEOUS) ×3 IMPLANT
CLIP APPLIE 11 MED OPEN (CLIP) IMPLANT
CLIP APPLIE 9.375 MED OPEN (MISCELLANEOUS) ×1 IMPLANT
CLOSURE WOUND 1/2 X4 (GAUZE/BANDAGES/DRESSINGS)
COVER BACK TABLE 60X90IN (DRAPES) ×3 IMPLANT
COVER MAYO STAND STRL (DRAPES) ×3 IMPLANT
COVER PROBE W GEL 5X96 (DRAPES) ×3 IMPLANT
COVER WAND RF STERILE (DRAPES) IMPLANT
DECANTER SPIKE VIAL GLASS SM (MISCELLANEOUS) IMPLANT
DERMABOND ADVANCED (GAUZE/BANDAGES/DRESSINGS) ×2
DERMABOND ADVANCED .7 DNX12 (GAUZE/BANDAGES/DRESSINGS) ×1 IMPLANT
DRAIN CHANNEL 19F RND (DRAIN) ×5 IMPLANT
DRAPE HALF SHEET 70X43 (DRAPES) ×3 IMPLANT
DRAPE LAPAROSCOPIC ABDOMINAL (DRAPES) ×3 IMPLANT
DRAPE UTILITY XL STRL (DRAPES) ×3 IMPLANT
DRSG PAD ABDOMINAL 8X10 ST (GAUZE/BANDAGES/DRESSINGS) ×5 IMPLANT
ELECT COATED BLADE 2.86 ST (ELECTRODE) ×3 IMPLANT
ELECT REM PT RETURN 9FT ADLT (ELECTROSURGICAL) ×3
ELECTRODE REM PT RTRN 9FT ADLT (ELECTROSURGICAL) ×1 IMPLANT
EVACUATOR SILICONE 100CC (DRAIN) ×5 IMPLANT
FILTER 7/8 IN (FILTER) IMPLANT
GAUZE SPONGE 4X4 12PLY STRL (GAUZE/BANDAGES/DRESSINGS) ×3 IMPLANT
GLOVE BIO SURGEON STRL SZ7 (GLOVE) ×2 IMPLANT
GLOVE BIOGEL PI IND STRL 7.5 (GLOVE) IMPLANT
GLOVE BIOGEL PI INDICATOR 7.5 (GLOVE) ×2
GLOVE EXAM NITRILE MD LF STRL (GLOVE) ×2 IMPLANT
GLOVE SURG SYN 7.5  E (GLOVE) ×2
GLOVE SURG SYN 7.5 E (GLOVE) ×1 IMPLANT
GLOVE SURG SYN 7.5 PF PI (GLOVE) ×1 IMPLANT
GOWN STRL REUS W/ TWL LRG LVL3 (GOWN DISPOSABLE) ×1 IMPLANT
GOWN STRL REUS W/ TWL XL LVL3 (GOWN DISPOSABLE) ×1 IMPLANT
GOWN STRL REUS W/TWL LRG LVL3 (GOWN DISPOSABLE) ×2
GOWN STRL REUS W/TWL XL LVL3 (GOWN DISPOSABLE) ×4
KIT MARKER MARGIN INK (KITS) IMPLANT
NDL HYPO 25X1 1.5 SAFETY (NEEDLE) ×1 IMPLANT
NDL SAFETY ECLIPSE 18X1.5 (NEEDLE) IMPLANT
NEEDLE HYPO 18GX1.5 SHARP (NEEDLE)
NEEDLE HYPO 25X1 1.5 SAFETY (NEEDLE) ×3 IMPLANT
NS IRRIG 1000ML POUR BTL (IV SOLUTION) ×3 IMPLANT
PACK BASIN DAY SURGERY FS (CUSTOM PROCEDURE TRAY) ×3 IMPLANT
PENCIL SMOKE EVACUATOR (MISCELLANEOUS) ×3 IMPLANT
PIN SAFETY STERILE (MISCELLANEOUS) ×3 IMPLANT
SLEEVE SCD COMPRESS KNEE MED (MISCELLANEOUS) ×3 IMPLANT
SPONGE LAP 18X18 RF (DISPOSABLE) ×5 IMPLANT
SPONGE LAP 4X18 RFD (DISPOSABLE) ×1 IMPLANT
STAPLER VISISTAT 35W (STAPLE) ×1 IMPLANT
STRIP CLOSURE SKIN 1/2X4 (GAUZE/BANDAGES/DRESSINGS) IMPLANT
SUT ETHILON 2 0 FS 18 (SUTURE) ×5 IMPLANT
SUT MNCRL AB 4-0 PS2 18 (SUTURE) ×3 IMPLANT
SUT SILK 2 0 SH (SUTURE) ×3 IMPLANT
SUT VIC AB 3-0 SH 27 (SUTURE) ×2
SUT VIC AB 3-0 SH 27X BRD (SUTURE) ×1 IMPLANT
SUT VICRYL 3-0 CR8 SH (SUTURE) ×3 IMPLANT
SYR BULB IRRIGATION 50ML (SYRINGE) ×1 IMPLANT
SYR CONTROL 10ML LL (SYRINGE) ×3 IMPLANT
TOWEL GREEN STERILE FF (TOWEL DISPOSABLE) ×3 IMPLANT
TUBE CONNECTING 20'X1/4 (TUBING) ×1
TUBE CONNECTING 20X1/4 (TUBING) ×2 IMPLANT
YANKAUER SUCT BULB TIP NO VENT (SUCTIONS) ×3 IMPLANT

## 2019-03-11 NOTE — Anesthesia Procedure Notes (Signed)
Anesthesia Regional Block: Pectoralis block   Pre-Anesthetic Checklist: ,, timeout performed, Correct Patient, Correct Site, Correct Laterality, Correct Procedure, Correct Position, site marked, Risks and benefits discussed, pre-op evaluation,  At surgeon's request and post-op pain management  Laterality: Right  Prep: Maximum Sterile Barrier Precautions used, chloraprep       Needles:  Injection technique: Single-shot  Needle Type: Echogenic Stimulator Needle     Needle Length: 9cm  Needle Gauge: 22     Additional Needles:   Procedures:,,,, ultrasound used (permanent image in chart),,,,  Narrative:  Start time: 03/11/2019 7:11 AM End time: 03/11/2019 7:14 AM Injection made incrementally with aspirations every 5 mL.  Performed by: Personally  Anesthesiologist: Brennan Bailey, MD  Additional Notes: Risks, benefits, and alternative discussed. Patient gave consent for procedure. Patient prepped and draped in sterile fashion. Sedation administered, patient remains easily responsive to voice. Relevant anatomy identified with ultrasound guidance. Local anesthetic given in 5cc increments with no signs or symptoms of intravascular injection. No pain or paraesthesias with injection. Patient monitored throughout procedure with signs of LAST or immediate complications. Tolerated well. Ultrasound image placed in chart.  Tawny Asal, MD

## 2019-03-11 NOTE — Interval H&P Note (Signed)
History and Physical Interval Note:  03/11/2019 7:30 AM  Eileen Mccoy  has presented today for surgery, with the diagnosis of RIGHT BREAST CANCER.  The various methods of treatment have been discussed with the patient and family.   Her daughter is here with her today.  After consideration of risks, benefits and other options for treatment, the patient has consented to  Procedure(s): RIGHT MASTECTOMY WITH RIGHT AXILLARY SENTINEL LYMPH NODE BIOPSY (Right) as a surgical intervention.  The patient's history has been reviewed, patient examined, no change in status, stable for surgery.  I have reviewed the patient's chart and labs.  Questions were answered to the patient's satisfaction.     [name]

## 2019-03-11 NOTE — Op Note (Signed)
03/11/2019  9:12 AM  PATIENT:  Eileen Mccoy, 69 y.o., female, MRN: [id number]  PREOP DIAGNOSIS:  RIGHT BREAST CANCER  POSTOP DIAGNOSIS:   Right breast cancer, 10 o'clock position (Tis, N0)  PROCEDURE:   Procedure(s):  RIGHT MASTECTOMY WITH RIGHT AXILLARY SENTINEL LYMPH NODE BIOPSY, deep sentinel lymph node biopsy  SURGEON:   Alphonsa Overall, M.D.  ASSISTANT:   None  ANESTHESIA:  General  Anesthesiologist: Brennan Bailey, MD CRNA: Genelle Bal, CRNA  General  ASA:  2  EBL:  <75  ml  BLOOD ADMINISTERED: none  DRAINS: Two 19 F Blake drain  LOCAL MEDICATIONS USED:   right pectoral block by anesthesia  SPECIMEN:   Right breast (suture lateral),  Right axillary sentinel lymph node (counts 750, background 15)  COUNTS CORRECT:  YES  INDICATIONS FOR PROCEDURE:  [name] is a 69 y.o. (DOB: 1950/04/03) AA female whose primary care physician is Celene Squibb, MD and comes for right mastectomy with right axillary sentinel lymph node biopsy.   She had three biopsies of her right breast that showed DCIS and covered an area of at least 6.0 cm.  She saw Drs. Magrinat and Isidore Moos for oncology.  She decided to proceed with a right mastectomy.  The indications and risks of the surgery were explained to the patient.  The risks include, but are not limited to, infection, bleeding, and nerve injury.   In the holding area, her right areola was injected with 1 millicurie of Technitium Sulfur Colloid.  OPERATIVE NOTE;  The patient was taken to room # 6 at [facility] where she underwent a general anesthesia  supervised by Anesthesiologist: Brennan Bailey, MD CRNA: Genelle Bal, CRNA. Her right breast and axilla were prepped with ChloraPrep and sterilely draped.    A time-out and the surgical check list was reviewed.    I made an elliptical incision including the areola in the right breast.  I developed skin flaps medially to the lateral edge of the sternum,  inferiorly to the investing fascia of the rectus abdominus muscle, laterally to the anterior edge of the latissimus dorsi muscle, and superiorly to about 2 finger breaths below the clavicle.  The breast was reflected off the pectoralis muscle from medial to lateral.  The lateral attachments in the right axilla were divided and the breast removed.  A long suture was placed on the lateral aspect of the breast.   I dissected into the right axilla and found a deep sentinel lymph node.  The node had counts of 750 with a background count of 15.  This was sent as a separate specimen.   I brought out 2 8 F Blake drains below the inferior flaps.  These were sewn in place with 2-0 Nylons.  I irrigated the wound with 1,000 cc of fluid.   The skin was closed with interrupted 3-0 Vicryl sutures and the skin was closed with a 4-0 Monocryl.  The wound was painted with Dermabond.    A pressure dressing was placed on the wound and the chest wrapped with a breast binder.  Her needle and sponge count were correct at the end of the case.   She was transferred to the recovery room in good condition.  Alphonsa Overall, MD, [facility] Pager: [phone] Office phone:  [phone]

## 2019-03-11 NOTE — Transfer of Care (Signed)
Immediate Anesthesia Transfer of Care Note  Patient: Eileen Mccoy  Procedure(s) Performed: RIGHT MASTECTOMY WITH RIGHT AXILLARY SENTINEL LYMPH NODE BIOPSY (Right Breast)  Patient Location: PACU  Anesthesia Type:GA combined with regional for post-op pain  Level of Consciousness: awake, alert  and oriented  Airway & Oxygen Therapy: Patient Spontanous Breathing and Patient connected to face mask oxygen  Post-op Assessment: Report given to RN and Post -op Vital signs reviewed and stable  Post vital signs: Reviewed and stable  Last Vitals:  Vitals Value Taken Time  BP 155/102 03/11/19 0904  Temp    Pulse 94 03/11/19 0906  Resp 14 03/11/19 0906  SpO2 96 % 03/11/19 0906  Vitals shown include unvalidated device data.  Last Pain:  Vitals:   03/11/19 0636  TempSrc: Oral  PainSc: 0-No pain      Patients Stated Pain Goal: 3 ([id number])  Complications: No apparent anesthesia complications

## 2019-03-11 NOTE — Anesthesia Postprocedure Evaluation (Signed)
Anesthesia Post Note  Patient: Eileen Mccoy  Procedure(s) Performed: RIGHT MASTECTOMY WITH RIGHT AXILLARY SENTINEL LYMPH NODE BIOPSY (Right Breast)     Patient location during evaluation: PACU Anesthesia Type: General Level of consciousness: awake and alert and oriented Pain management: pain level controlled Vital Signs Assessment: post-procedure vital signs reviewed and stable Respiratory status: spontaneous breathing, nonlabored ventilation and respiratory function stable Cardiovascular status: blood pressure returned to baseline Postop Assessment: no apparent nausea or vomiting Anesthetic complications: no    Last Vitals:  Vitals:   03/11/19 0945 03/11/19 0958  BP: 125/78   Pulse: 74 71  Resp: 12 10  Temp:  37.2 C  SpO2: 100% 97%    Last Pain:  Vitals:   03/11/19 0958  TempSrc:   PainSc: [name]

## 2019-03-11 NOTE — Progress Notes (Signed)
Emotional support provided through nuclear medicine breast injections. Vital signs stable. Patient tolerated well.  

## 2019-03-11 NOTE — Progress Notes (Signed)
Assisted Dr. Daiva Huge with right, ultrasound guided, pectoralis block. Side rails up, monitors on throughout procedure. See vital signs in flow sheet. Tolerated Procedure well.

## 2019-03-11 NOTE — Anesthesia Procedure Notes (Signed)
Procedure Name: LMA Insertion Date/Time: 03/11/2019 7:42 AM Performed by: Genelle Bal, CRNA Pre-anesthesia Checklist: Patient identified, Emergency Drugs available, Suction available and Patient being monitored Patient Re-evaluated:Patient Re-evaluated prior to induction Oxygen Delivery Method: Circle system utilized Preoxygenation: Pre-oxygenation with 100% oxygen Induction Type: IV induction Ventilation: Mask ventilation without difficulty LMA: LMA inserted LMA Size: 4.0 Number of attempts: 1 Airway Equipment and Method: Bite block Placement Confirmation: positive ETCO2 Tube secured with: Tape Dental Injury: Teeth and Oropharynx as per pre-operative assessment

## 2019-03-12 ENCOUNTER — Encounter: Payer: Self-pay | Admitting: *Deleted

## 2019-03-12 MED ORDER — TRAMADOL HCL 50 MG PO TABS: 50.0000 mg | ORAL_TABLET | Freq: Four times a day (QID) | ORAL | 0 refills | Status: DC | PRN

## 2019-03-12 NOTE — Discharge Summary (Signed)
Physician Discharge Summary  Patient ID:  Eileen Mccoy  MRN: [id number]  DOB/AGE: 10/01/1950 69 y.o.  Admit date: 03/11/2019 Discharge date: 03/12/2019  Discharge Diagnoses:   Active Problems:   Breast cancer, stage 0, right  Operation: Procedure(s): RIGHT MASTECTOMY WITH RIGHT AXILLARY SENTINEL LYMPH NODE BIOPSY on 03/11/2019 - [facility]  Discharged Condition: good  Hospital Course: Eileen Mccoy is an 69 y.o. female whose primary care physician is Celene Squibb, MD and who was admitted 03/11/2019 with a chief complaint of right breast cancer.   She was brought to the operating room on 03/11/2019 and underwent  RIGHT MASTECTOMY WITH RIGHT AXILLARY SENTINEL LYMPH NODE BIOPSY.   She is one day post op.  She did well last night and is ready for discharge.  The discharge instructions were reviewed with the patient.  Consults: None  Significant Diagnostic Studies: Results for orders placed or performed during the hospital encounter of 62/13/08  Basic metabolic panel  Result Value Ref Range   Sodium 142 135 - 145 mmol/L   Potassium 4.7 3.5 - 5.1 mmol/L   Chloride 102 98 - 111 mmol/L   CO2 28 22 - 32 mmol/L   Glucose, Bld 109 (H) 70 - 99 mg/dL   BUN 12 8 - 23 mg/dL   Creatinine, Ser 0.71 0.44 - 1.00 mg/dL   Calcium 9.6 8.9 - 10.3 mg/dL   GFR calc non Af Amer >60 >60 mL/min   GFR calc Af Amer >60 >60 mL/min   Anion gap 12 5 - 15    NM Sentinel Node Inj-No Rpt (Breast)  Result Date: 03/11/2019 Sulfur colloid was injected by the nuclear medicine technologist for melanoma sentinel node.   MM CLIP PLACEMENT RIGHT  Result Date: 02/11/2019 CLINICAL DATA:  Confirmation of barbell clip placement after MRI guided core needle biopsy of the ANTERIOR extent of non-mass enhancement involving the UPPER OUTER QUADRANT of the RIGHT breast. EXAM: 2D AND 3D TOMOSYNTHESIS DIAGNOSTIC RIGHT MAMMOGRAM POST MRI BIOPSY COMPARISON:  Previous exam(s). FINDINGS: Tomosynthesis and synthesized full field  CC and mediolateral images were obtained following MRI guided biopsy of the ANTERIOR extent of non-mass enhancement involving the UPPER OUTER QUADRANT of the RIGHT breast. The barbell shaped tissue marking clip is at the site of the biopsied non-mass enhancement involving the [place] at ANTERIOR depth. There is a post biopsy hematoma in the UPPER OUTER QUADRANT at ANTERIOR depth measuring approximately 2.0 x 2.4 x 2.4 cm. The barbell shaped tissue marking clip placed today is approximately 4 cm anterior to the coil shaped tissue marker clip placed at the site of biopsy-proven DCIS medially in the [place] of the RIGHT breast. The barbell shaped tissue marking clip placed today is approximately 7 cm anteromedial to the X shaped tissue marker clip placed at the site of biopsy-proven DCIS laterally in the [place] of the RIGHT breast. I note that the calcifications associated with DCIS extend approximately 2 cm posterior to the previously placed X shaped tissue marker clip in the [place]. IMPRESSION: 1. Appropriate positioning of the barbell shaped tissue marker clip at the site of the biopsied non-mass enhancement in the UPPER OUTER QUADRANT of the RIGHT breast at ANTERIOR depth. 2. A post biopsy hematoma is present, measured above. 3. The barbell shaped tissue marker clip placed today is approximately 4 cm anterior to the coil shaped tissue marker clip (biopsy-proven DCIS) and is approximately 7 cm anteromedial to the X shaped tissue marker clip (  biopsy-proven DCIS). 4. The calcifications associated with DCIS extend approximately 2 cm posterior to the X shaped tissue marker clip (biopsy-proven DCIS). Final Assessment: Post Procedure Mammograms for Marker Placement Electronically Signed   By: Evangeline Dakin M.D.   On: 02/11/2019 10:47   MR RT BREAST BX W LOC DEV 1ST LESION IMAGE BX SPEC MR GUIDE  Addendum Date: 02/24/2019   ADDENDUM REPORT: 02/24/2019 14:57  ADDENDUM: This addendum is to document a telephone conversation with Dr. [name] that occurred today. Our conversation was with regard to the possibility of a breast sparing procedure if the patient is adamant that she does not want mastectomy. In that case, the patient would need a quadrantectomy of the UPPER OUTER QUADRANT of the RIGHT breast. He questioned how one might bracket that with the radioactive seeds. My suggestion to him was that I would use 3 seeds placed as follows: # 1) ANTERIOR to the dumbbell-shaped tissue marker clip placed at the time of MRI biopsy, as the dissection will need to go as far ANTERIOR as the nipple. # 2) POSTERIOR and LATERAL to the X shaped tissue marker clip (in order to include the calcifications that are present POSTERIOR to the clip). # 3) POSTERIOR to the coil shaped tissue marker clip (including POSTERIOR to the architectural distortion which is located posterior to the coil clip), as the dissection will need to go as far POSTERIOR as the chest wall. Electronically Signed   By: Evangeline Dakin M.D.   On: 02/24/2019 14:57   Addendum Date: 02/12/2019   ADDENDUM REPORT: 02/12/2019 15:05 ADDENDUM: Pathology revealed HIGH GRADE DUCTAL CARCINOMA IN SITU, CALCIFICATIONS AND NECROSIS of the RIGHT breast, upper outer quadrant, anterior extent of non mass enhancement. This was found to be concordant by Dr. Peggye Fothergill. Pathology results were discussed with the patient by telephone. The patient reported doing well after the biopsy with tenderness at the site. Post biopsy instructions and care were reviewed and questions were answered. The patient was encouraged to call [facility] for any additional concerns. The patient has a recent diagnosis of right breast cancer and should follow her outlined treatment plan. Pathology results reported by Stacie Acres, RN on 02/12/2019. Electronically Signed   By: Evangeline Dakin M.D.   On: 02/12/2019 15:05    Result Date: 02/24/2019 CLINICAL DATA:  69 year old with recent biopsy-proven DCIS involving the [place] and the RIGHT UPPER INNER QUADRANT. Diagnostic Breast MRI demonstrated non-mass enhancement extending ANTERIOR and INFERIOR to the biopsy site in the [place]. The ANTERIOR extent of the non-mass enhancement is sampled. EXAM: MRI GUIDED CORE NEEDLE BIOPSY OF THE RIGHT BREAST TECHNIQUE: Multiplanar, multisequence MR imaging of the RIGHT breast was performed both before and after administration of intravenous contrast. CONTRAST:  7 mL Gadavist (gadobutrol) IV. COMPARISON:  Previous exams. FINDINGS: I met with the patient, and we discussed the procedure of MRI guided biopsy, including risks, benefits, and alternatives. Specifically, we discussed the risks of infection, bleeding, tissue injury, clip migration, and inadequate sampling. Informed, written consent was given. The usual time out protocol was performed immediately prior to the procedure. Using sterile technique with chlorhexidine as skin antisepsis, 1% lidocaine and 1% lidocaine with epinephrine, using MRI guidance, a 9 gauge vacuum assisted device was used to perform biopsy of the anterior extent of non-mass enhancement involving the UPPER OUTER QUADRANT of the RIGHT breast using a lateral approach. Lesion quadrant: UPPER OUTER QUADRANT. At the conclusion of the procedure,  a barbell shaped tissue marker clip was deployed into the biopsy cavity. Follow-up 2-view mammogram was performed and dictated separately. IMPRESSION: MRI guided biopsy of the anterior extent of non-mass enhancement involving the UPPER OUTER QUADRANT of the RIGHT breast. Electronically Signed: By: Evangeline Dakin M.D. On: 02/11/2019 10:37    Discharge Exam:  Vitals:   03/12/19 0600 03/12/19 0700  BP:  135/69  Pulse: 83 82  Resp:  18  Temp:  99.8 F (37.7 C)  SpO2: 97% 97%    General: WN older AA F who is alert and generally healthy  appearing.  Chest:  Left chest wound looks good.  Drains 1/2 - 80/41 cc last 24 hours  Discharge Medications:   Allergies as of 03/12/2019      Reactions   Codeine    Stomach ache, nausea      Medication List    TAKE these medications   acetaminophen 650 MG CR tablet Commonly known as: TYLENOL Take 650 mg by mouth every 8 (eight) hours as needed for pain.   Albuterol Sulfate 108 (90 Base) MCG/ACT Aepb Inhale 2 puffs into the lungs every 6 (six) hours as needed (shortness of breath).   cetirizine 5 MG tablet Commonly known as: ZYRTEC Take 5 mg by mouth daily.   Fluticasone-Salmeterol 250-50 MCG/DOSE Aepb Commonly known as: ADVAIR Inhale 1 puff into the lungs 2 (two) times daily.   hydrochlorothiazide 12.5 MG tablet Commonly known as: HYDRODIURIL Take 25 mg by mouth daily.   losartan 25 MG tablet Commonly known as: COZAAR Take 25 mg by mouth daily.   multivitamin tablet Take 1 tablet by mouth daily.   omeprazole 20 MG capsule Commonly known as: PRILOSEC Take 1 capsule (20 mg total) by mouth daily.   SENNA S PO Take 1 tablet by mouth as needed (constipation).   SOOTHE XP OP Apply to eye. One drop to each eye every morning   traMADol 50 MG tablet Commonly known as: Ultram Take 1 tablet (50 mg total) by mouth every 6 (six) hours as needed for moderate pain or severe pain.   Vitamin D3 25 MCG (1000 UT) Caps Take by mouth.       Disposition: Discharge disposition: 01-Home or Self Care       Discharge Instructions    Diet - low sodium heart healthy   Complete by: As directed    Increase activity slowly   Complete by: As directed       Signed: Alphonsa Overall, M.D., [facility] Surgery Office:  [phone]  03/12/2019, 8:06 AM

## 2019-03-12 NOTE — Discharge Instructions (Signed)
[facility] - DISCHARGE INSTRUCTIONS TO PATIENT   Activity:  Lifting - No heavy liffting for 7 days, then no limits                       Practice your Covid-19 protection:  Wear a mask, social distance, and wash your hands frequently  Wound Care:   Leave the incision dry for 2 days, then you may shower         Empty drains twice daily, record the amount of drainage, bring that record with you to the office.  Diet:  As tolerated  Follow up appointment:  Call Dr. Pollie Friar office [facility]) at [phone] for an appointment in 1 week..  Medications and dosages:  Resume your home medications.  You have a prescription for:  Ultram  Call Dr. [name] or his office  [phone]) if you have:  Temperature greater than 100.4,  Persistent nausea and vomiting,  Severe uncontrolled pain,  Redness, tenderness, or signs of infection (pain, swelling, redness, odor or green/yellow discharge around the site),  Difficulty breathing, headache or visual disturbances,  Any other questions or concerns you may have after discharge.  In an emergency, call 911 or go to an Emergency Department at a nearby hospital.  About my Jackson-Pratt Bulb Drain  What is a Jackson-Pratt bulb? A Jackson-Pratt is a soft, round device used to collect drainage. It is connected to a long, thin drainage catheter, which is held in place by one or two small stiches near your surgical incision site. When the bulb is squeezed, it forms a vacuum, forcing the drainage to empty into the bulb.  Emptying the Jackson-Pratt bulb- To empty the bulb: 1. Release the plug on the top of the bulb. 2. Pour the bulb's contents into a measuring container which your nurse will provide. 3. Record the time emptied and amount of drainage. Empty the drain(s) as often as your     doctor or nurse recommends.  Date                  Time                    Amount (Drain 1)                 Amount (Drain  2)  _____________________________________________________________________  _____________________________________________________________________  _____________________________________________________________________  _____________________________________________________________________  _____________________________________________________________________  _____________________________________________________________________  _____________________________________________________________________  _____________________________________________________________________  Squeezing the Jackson-Pratt Bulb- To squeeze the bulb: 1. Make sure the plug at the top of the bulb is open. 2. Squeeze the bulb tightly in your fist. You will hear air squeezing from the bulb. 3. Replace the plug while the bulb is squeezed. 4. Use a safety pin to attach the bulb to your clothing. This will keep the catheter from     pulling at the bulb insertion site.  When to call your doctor- Call your doctor if:  Drain site becomes red, swollen or hot.  You have a fever greater than 101 degrees F.  There is oozing at the drain site.  Drain falls out (apply a guaze bandage over the drain hole and secure it with tape).  Drainage increases daily not related to activity patterns. (You will usually have more drainage when you are active than when you are resting.)  Drainage has a bad odor.

## 2019-03-13 ENCOUNTER — Encounter: Payer: Self-pay | Admitting: *Deleted

## 2019-03-14 LAB — SURGICAL PATHOLOGY

## 2019-03-17 ENCOUNTER — Encounter: Payer: Self-pay | Admitting: *Deleted

## 2019-03-25 ENCOUNTER — Ambulatory Visit: Payer: Medicare Other | Admitting: Physical Therapy

## 2019-03-27 ENCOUNTER — Ambulatory Visit: Payer: Medicare Other | Attending: Surgery | Admitting: Physical Therapy

## 2019-03-27 ENCOUNTER — Other Ambulatory Visit: Payer: Self-pay

## 2019-03-27 ENCOUNTER — Encounter: Payer: Self-pay | Admitting: Physical Therapy

## 2019-03-27 DIAGNOSIS — R600 Localized edema: Secondary | ICD-10-CM | POA: Diagnosis present

## 2019-03-27 DIAGNOSIS — M25611 Stiffness of right shoulder, not elsewhere classified: Secondary | ICD-10-CM | POA: Diagnosis not present

## 2019-03-27 DIAGNOSIS — M6281 Muscle weakness (generalized): Secondary | ICD-10-CM | POA: Insufficient documentation

## 2019-03-27 DIAGNOSIS — M25511 Pain in right shoulder: Secondary | ICD-10-CM | POA: Insufficient documentation

## 2019-03-27 DIAGNOSIS — Z483 Aftercare following surgery for neoplasm: Secondary | ICD-10-CM | POA: Insufficient documentation

## 2019-03-27 DIAGNOSIS — R293 Abnormal posture: Secondary | ICD-10-CM | POA: Insufficient documentation

## 2019-03-27 NOTE — Therapy (Signed)
[place], [place], [address] Phone: [phone]   Fax:  [phone]  Physical Therapy Evaluation  Patient Details  Name: Eileen Mccoy MRN: [id number] Date of Birth: 11/19/1950 Referring Provider (PT): [name]   Encounter Date: 03/27/2019  PT End of Session - 03/27/19 1601    Visit Number  1    Number of Visits  9    Date for PT Re-Evaluation  05/08/19    PT Start Time  1610    PT Stop Time  1655    PT Time Calculation (min)  45 min    Activity Tolerance  Patient tolerated treatment well    Behavior During Therapy  [facility] for tasks assessed/performed       Past Medical History:  Diagnosis Date  . Asthma 02/23/2016  . Ductal carcinoma in situ (DCIS) of right breast 01/21/2019  . GERD (gastroesophageal reflux disease)   . H. pylori infection   . Hypertension     Past Surgical History:  Procedure Laterality Date  . ABDOMINAL HYSTERECTOMY    . BIOPSY  03/16/2016   Procedure: BIOPSY;  Surgeon: Rogene Houston, MD;  [facility];  Service: Endoscopy;;  gastric biopsies  . ESOPHAGOGASTRODUODENOSCOPY N/A 03/16/2016   Procedure: ESOPHAGOGASTRODUODENOSCOPY (EGD);  Surgeon: Rogene Houston, MD;  Location[facility];  Service: Endoscopy;  Laterality: N/A;  3:00  . MASTECTOMY W/ SENTINEL NODE BIOPSY Right 03/11/2019   Procedure: RIGHT MASTECTOMY WITH RIGHT AXILLARY SENTINEL LYMPH NODE BIOPSY;  Surgeon: Alphonsa Overall, MD;  Location: [facility];  Service: General;  Laterality: Right;  . PARTIAL HYSTERECTOMY     1998 for fibroids  . POLYPECTOMY  03/16/2016   Procedure: POLYPECTOMY;  Surgeon: Rogene Houston, MD;  [facility];  Service: Endoscopy;;  gastric polypectomies  . THYROIDECTOMY, PARTIAL      There were no vitals filed for this visit.   Subjective Assessment - 03/27/19 1512    Subjective  I have had a little swelling but I have been keeping icing it.     Pertinent History  R breast cancer s/p R mastectomy and SLNB on 03/11/19    Patient Stated Goals  pt states she does not have a goal for therapy because she does not feel like she is having any trouble    Currently in Pain?  No/denies    Pain Score  0-No pain         [facility] PT Assessment - 03/27/19 0001      Assessment   Medical Diagnosis  R breast cancer    Referring Provider (PT)  [name]    Onset Date/Surgical Date  03/11/19    Hand Dominance  Right    Prior Therapy  none      Precautions   Precautions  Other (comment)    Precaution Comments  at risk of lymphedema      Restrictions   Weight Bearing Restrictions  No      Balance Screen   Has the patient fallen in the past 6 months  No    Has the patient had a decrease in activity level because of a fear of falling?   No    Is the patient reluctant to leave their home because of a fear of falling?   No      Home Environment   Living Environment  Private residence    Living Arrangements  Spouse/significant other    Available Help at  Discharge  Family    Type of Home  House      Prior Function   Level of [place]  Retired    Leisure  pt walks for exercise but states she has not walked since it got cold      Cognition   Overall Cognitive Status  Within Functional Limits for tasks assessed      Observation/Other Assessments   Observations  paper tape intact over healing scar, swelling in R axillary area      Posture/Postural Control   Posture/Postural Control  Postural limitations    Postural Limitations  Rounded Shoulders;Forward head      ROM / Strength   AROM / PROM / Strength  AROM      AROM   AROM Assessment Site  Shoulder    Right/Left Shoulder  Right;Left    Right Shoulder Flexion  140 Degrees    Right Shoulder ABduction  126 Degrees    Right Shoulder Internal Rotation  55 Degrees    Right Shoulder External Rotation  90 Degrees    Left Shoulder Flexion  160 Degrees    Left  Shoulder ABduction  173 Degrees    Left Shoulder Internal Rotation  66 Degrees    Left Shoulder External Rotation  84 Degrees        LYMPHEDEMA/ONCOLOGY QUESTIONNAIRE - 03/27/19 1530      Type   Cancer Type  R breast cancer      Surgeries   Mastectomy Date  03/11/19    Sentinel Lymph Node Biopsy Date  03/11/19    Number Lymph Nodes Removed  3      Date Lymphedema/Swelling Started   Date  03/11/19      Treatment   Active Chemotherapy Treatment  No    Past Chemotherapy Treatment  No    Active Radiation Treatment  No    Past Radiation Treatment  No    Current Hormone Treatment  No   might need to   Past Hormone Therapy  No      What other symptoms do you have   Are you Having Heaviness or Tightness  Yes    Are you having Pain  Yes    Are you having pitting edema  No    Is it Hard or Difficult finding clothes that fit  No    Do you have infections  No    Is there Decreased scar mobility  Yes   scar still healing     Lymphedema Assessments   Lymphedema Assessments  Upper extremities      Right Upper Extremity Lymphedema   15 cm Proximal to Olecranon Process  32.5 cm    Olecranon Process  26.5 cm    15 cm Proximal to Ulnar Styloid Process  24.5 cm    Just Proximal to Ulnar Styloid Process  16 cm    Across Hand at [employer]  19.8 cm    At [place] of 2nd Digit  6.4 cm      Left Upper Extremity Lymphedema   15 cm Proximal to Olecranon Process  32.8 cm    Olecranon Process  27 cm    15 cm Proximal to Ulnar Styloid Process  25 cm    Just Proximal to Ulnar Styloid Process  15.5 cm    Across Hand at [employer]  18.5 cm    At [place] of 2nd Digit  6.1 cm  Objective measurements completed on examination: See above findings.      [facility] Adult PT Treatment/Exercise - 03/27/19 0001      Manual Therapy   Manual Therapy  Edema management    Edema Management  created foam chip pack for pt to wear over area of axillary swelling to help decrease  swelling             PT Education - 03/27/19 1601    Education Details  lymphedema risk reduction practices, post op breast exercises, anatomy and physiology of lymphatic system    Person(s) Educated  Patient    Methods  Explanation;Handout    Comprehension  Verbalized understanding          PT Long Term Goals - 03/27/19 1607      PT LONG TERM GOAL #1   Title  Pt will demosntrate 160 degrees of R shoulder flexion to allow her to reach items overhead.    Baseline  140    Time  4    Period  Weeks    Status  New    Target Date  05/08/19      PT LONG TERM GOAL #2   Title  Pt will demonstrate 165 degrees of right shoulder abduction to allow her to reach out to the side.    Baseline  126    Time  4    Period  Weeks    Status  New    Target Date  05/08/19      PT LONG TERM GOAL #3   Title  Pt will report a 75% improvement in R axillary swelling to decrease risk of infection    Time  4    Period  Weeks    Status  New    Target Date  05/08/19      PT LONG TERM GOAL #4   Title  Pt will be independent in a home exercise program for continued strengthening and stretching.    Time  4    Period  Weeks    Status  New    Target Date  05/08/19             Plan - 03/27/19 1602    Clinical Impression Statement  Pt recently underwent a R mastectomy and SLNB for treatment of R breast cancer. She does not require any chemo and radiation. She presents with R axilary swelling and has been keeping ice on the area consistently. Educated pt that this could increase risk of lymphedema and created a foam chip pack for pt to wear over the area for compression instead. Pt has decreased right shoulder ROM when compared to left. She has tightness across her chest. Her incisions are still healing and she has tape over healing wounds. She has a follow up in 2 weeks with her surgeon. Will begin therapy after this to give her scars time to heal. Pt would benefit from skilled PT services to  increase right shoulder ROM, decrease R axillary swelling and progress pt towards independence with a home exercise program.    Examination-Activity Limitations  Reach Overhead;Carry;Lift    Stability/Clinical Decision Making  Stable/Uncomplicated    Clinical Decision Making  Low    Rehab Potential  Excellent    PT Frequency  2x / week   starting on 2/18   PT Duration  4 weeks    PT Treatment/Interventions  ADLs/Self Care Home Management;Therapeutic exercise;Therapeutic activities;Manual techniques;Manual lymph drainage;Compression bandaging;Scar mobilization;Passive range of motion;Taping  PT Next Visit Plan  begin PROM to R shoulder, pulleys, ball, when scar is healed can do scar mobilization, MLD to R axillary swelling if still present, see if chip pack helped    PT Home Exercise Plan  post op breast exercises, wear chip pack over swelling    Consulted and Agree with Plan of Care  Patient       Patient will benefit from skilled therapeutic intervention in order to improve the following deficits and impairments:  Pain, Increased fascial restricitons, Decreased scar mobility, Postural dysfunction, Decreased range of motion, Decreased strength, Impaired UE functional use, Increased edema, Decreased knowledge of precautions  Visit Diagnosis: Stiffness of right shoulder, not elsewhere classified  Acute pain of right shoulder  Aftercare following surgery for neoplasm  Localized edema  Abnormal posture  Muscle weakness (generalized)     Problem List Patient Active Problem List   Diagnosis Date Noted  . Breast cancer, stage 0, right 03/11/2019  . Ductal carcinoma in situ (DCIS) of right breast 01/21/2019  . Diverticulosis of intestine 01/02/2017  . Atherosclerosis of aorta ([place]) 01/02/2017  . Asthma in adult, mild persistent, uncomplicated [id number]  . Kidney stone 02/23/2016  . Gastroesophageal reflux disease without esophagitis 02/23/2016    Allyson Sabal  [facility] 03/27/2019, 4:09 PM  [place], [place], [address] Phone: [phone]   Fax:  [phone]  Name: Ezmerelda Macchione MRN: [id number] Date of Birth: Jun 28, 1950  Manus Gunning, PT 03/27/19 4:10 PM

## 2019-04-05 ENCOUNTER — Ambulatory Visit: Payer: Medicare Other | Attending: Internal Medicine

## 2019-04-05 ENCOUNTER — Other Ambulatory Visit: Payer: Self-pay

## 2019-04-05 DIAGNOSIS — Z23 Encounter for immunization: Secondary | ICD-10-CM | POA: Insufficient documentation

## 2019-04-05 NOTE — Progress Notes (Signed)
   Covid-19 Vaccination Clinic  Name:  Eileen Mccoy    MRN: [id number] DOB: 29-Sep-1950  04/05/2019  [name] was observed post Covid-19 immunization for 15 minutes without incidence. She was provided with Vaccine Information Sheet and instruction to access the V-Safe system.   [name] was instructed to call 911 with any severe reactions post vaccine: Marland Kitchen Difficulty breathing  . Swelling of your face and throat  . A fast heartbeat  . A bad rash all over your body  . Dizziness and weakness    Immunizations Administered    Name Date Dose VIS Date Route   Moderna COVID-19 Vaccine 04/05/2019 11:37 AM 0.5 mL 01/21/2019 Intramuscular   Manufacturer: Moderna   Lot: [id number]   [id number]

## 2019-04-10 ENCOUNTER — Ambulatory Visit: Payer: Medicare Other | Admitting: Physical Therapy

## 2019-04-15 ENCOUNTER — Other Ambulatory Visit: Payer: Self-pay

## 2019-04-15 ENCOUNTER — Ambulatory Visit: Payer: Medicare Other

## 2019-04-15 DIAGNOSIS — M25611 Stiffness of right shoulder, not elsewhere classified: Secondary | ICD-10-CM | POA: Diagnosis not present

## 2019-04-15 DIAGNOSIS — M6281 Muscle weakness (generalized): Secondary | ICD-10-CM

## 2019-04-15 DIAGNOSIS — R293 Abnormal posture: Secondary | ICD-10-CM

## 2019-04-15 DIAGNOSIS — M25511 Pain in right shoulder: Secondary | ICD-10-CM

## 2019-04-15 DIAGNOSIS — R600 Localized edema: Secondary | ICD-10-CM

## 2019-04-15 DIAGNOSIS — Z483 Aftercare following surgery for neoplasm: Secondary | ICD-10-CM

## 2019-04-15 NOTE — Patient Instructions (Signed)

## 2019-04-15 NOTE — Therapy (Signed)
[place], [place], [address] Phone: [phone]   Fax:  [phone]  Physical Therapy Treatment  Patient Details  Name: Eileen Mccoy MRN: [id number] Date of Birth: 1950/06/05 Referring Provider (PT): Lavone Neri Date: 04/15/2019  PT End of Session - 04/15/19 [id number]    Visit Number  2    Number of Visits  9    Date for PT Re-Evaluation  05/08/19    PT Start Time  1606    PT Stop Time  1659    PT Time Calculation (min)  53 min    Activity Tolerance  Patient tolerated treatment well    Behavior During Therapy  [facility] for tasks assessed/performed       Past Medical History:  Diagnosis Date  . Asthma 02/23/2016  . Ductal carcinoma in situ (DCIS) of right breast 01/21/2019  . GERD (gastroesophageal reflux disease)   . H. pylori infection   . Hypertension     Past Surgical History:  Procedure Laterality Date  . ABDOMINAL HYSTERECTOMY    . BIOPSY  03/16/2016   Procedure: BIOPSY;  Surgeon: Rogene Houston, MD;  [facility];  Service: Endoscopy;;  gastric biopsies  . ESOPHAGOGASTRODUODENOSCOPY N/A 03/16/2016   Procedure: ESOPHAGOGASTRODUODENOSCOPY (EGD);  Surgeon: Rogene Houston, MD;  [facility];  Service: Endoscopy;  Laterality: N/A;  3:00  . MASTECTOMY W/ SENTINEL NODE BIOPSY Right 03/11/2019   Procedure: RIGHT MASTECTOMY WITH RIGHT AXILLARY SENTINEL LYMPH NODE BIOPSY;  Surgeon: Alphonsa Overall, MD;  Location: [place];  Service: General;  Laterality: Right;  . PARTIAL HYSTERECTOMY     1998 for fibroids  . POLYPECTOMY  03/16/2016   Procedure: POLYPECTOMY;  Surgeon: Rogene Houston, MD;  [facility];  Service: Endoscopy;;  gastric polypectomies  . THYROIDECTOMY, PARTIAL      There were no vitals filed for this visit.  Subjective Assessment - 04/15/19 1610    Subjective  Pt reports that she has been doing her exercises and has been stretched  really good. She states that she saw the MD last Friday and he said that everything was looking good and she had less swelling in her axilla.    Pertinent History  R breast cancer s/p R mastectomy and SLNB on 03/11/19    Patient Stated Goals  pt states she does not have a goal for therapy because she does not feel like she is having any trouble    Currently in Pain?  Yes    Pain Score  2     Pain Location  Axilla    Pain Orientation  Right                       [facility] Adult PT Treatment/Exercise - 04/15/19 0001      Exercises   Exercises  Shoulder      Shoulder Exercises: Supine   Horizontal ABduction  Strengthening;Both;10 reps    Theraband Level (Shoulder Horizontal ABduction)  Level 1 (Yellow)    Horizontal ABduction Limitations  demonstration for correct movement.     External Rotation  Strengthening;Both;10 reps    Theraband Level (Shoulder External Rotation)  Level 1 (Yellow)    External Rotation Limitations  Tactile cueing for elbows to stay tucked and direction of palm for correct movement.     Flexion  Strengthening;Both;10 reps    Theraband Level (Shoulder Flexion)  Level 1 (Yellow)  Flexion Limitations  light tension into abduction throughout. VC for correct activity and to keep R elbow extended. VC to avoid pain and stress on incisional area.     Diagonals  Strengthening;10 reps;Right    Theraband Level (Shoulder Diagonals)  Level 1 (Yellow)    Diagonals Limitations  VC for correct movement and tactile cueing. VC to avoid pain.     Other Supine Exercises  Ball on wall into flexion/abduction 20x      Shoulder Exercises: Pulleys   Flexion  2 minutes    Flexion Limitations  demonstration    ABduction  2 minutes    ABduction Limitations  demonstration for correct movement and VC to prevent compensation with side trunk flexion.       Manual Therapy   Manual Therapy  Myofascial release;Soft tissue mobilization;Manual Lymphatic Drainage (MLD)    Soft tissue  mobilization  light STM to the R upper trapezius, pectoralis major over the anterior chest; slight decrease in tension following STM    Myofascial Release  In the R axilla with abduction avoiding stress on the incision.     Manual Lymphatic Drainage (MLD)  In supine: short neck, swimming in the terminus, Bil shoulders, Bil axillary nodes and R inguinal nodes, Anterior inter-axillary anastomosis, R axillo-inguinal anastomosis; Re-worked all surfaces following STM.              PT Education - 04/15/19 1658    Education Details  Pt was educated on performing scapular series and to continue to monitor her incision to make sure there is no extra stress in order to decresae risk for dehiscence.    Person(s) Educated  Patient    Methods  Explanation;Demonstration;Verbal cues;Handout;Tactile cues    Comprehension  Verbalized understanding;Returned demonstration          PT Long Term Goals - 03/27/19 1607      PT LONG TERM GOAL #1   Title  Pt will demosntrate 160 degrees of R shoulder flexion to allow her to reach items overhead.    Baseline  140    Time  4    Period  Weeks    Status  New    Target Date  05/08/19      PT LONG TERM GOAL #2   Title  Pt will demonstrate 165 degrees of right shoulder abduction to allow her to reach out to the side.    Baseline  126    Time  4    Period  Weeks    Status  New    Target Date  05/08/19      PT LONG TERM GOAL #3   Title  Pt will report a 75% improvement in R axillary swelling to decrease risk of infection    Time  4    Period  Weeks    Status  New    Target Date  05/08/19      PT LONG TERM GOAL #4   Title  Pt will be independent in a home exercise program for continued strengthening and stretching.    Time  4    Period  Weeks    Status  New    Target Date  05/08/19            Plan - 04/15/19 1610    Clinical Impression Statement  Pt presents to physical therapy status post 4 weeks after R masectomy. She has improved her  ROM and her R axillary swelling has improved but continues. She was able  to tolerate ROM activities this session and light scapular strenghtening in supine without increased stress on her incision site. Visual on incision throughout without increased stress/stretching. Light STM was performed to the R upper trapezius, and pectoralis major; minimal improvement following STM. P/ROM performed with light myofascial stretching along the R lateral trunk wall under the axilla avoiding stress on the incision. MLD was perfomred for the R axillo-inguinal anastomosis from the R axilla and across the anterior inter-axillary anastomosis to help clear fluid from this area following STM. Pt HEP was updated. She will benefit from continued POC at this time.    Examination-Activity Limitations  Reach Overhead;Carry;Lift    PT Frequency  2x / week    PT Duration  4 weeks    PT Treatment/Interventions  ADLs/Self Care Home Management;Therapeutic exercise;Therapeutic activities;Manual techniques;Manual lymph drainage;Compression bandaging;Scar mobilization;Passive range of motion;Taping    PT Next Visit Plan  continue PROM to R shoulder, pulleys, ball, when scar is healed can do scar mobilization, MLD to R axillary swelling if still present, assess scapular series    PT Home Exercise Plan  wear chip pack over swelling, supine scapular series.    Consulted and Agree with Plan of Care  Patient       Patient will benefit from skilled therapeutic intervention in order to improve the following deficits and impairments:  Pain, Increased fascial restricitons, Decreased scar mobility, Postural dysfunction, Decreased range of motion, Decreased strength, Impaired UE functional use, Increased edema, Decreased knowledge of precautions  Visit Diagnosis: Stiffness of right shoulder, not elsewhere classified  Acute pain of right shoulder  Aftercare following surgery for neoplasm  Localized edema  Abnormal posture  Muscle  weakness (generalized)     Problem List Patient Active Problem List   Diagnosis Date Noted  . Breast cancer, stage 0, right 03/11/2019  . Ductal carcinoma in situ (DCIS) of right breast 01/21/2019  . Diverticulosis of intestine 01/02/2017  . Atherosclerosis of aorta ([place]) 01/02/2017  . Asthma in adult, mild persistent, uncomplicated [id number]  . Kidney stone 02/23/2016  . Gastroesophageal reflux disease without esophagitis 02/23/2016    Ander Purpura, PT 04/15/2019, 5:08 PM  [place], [place], [address] Phone: [phone]   Fax:  [phone]  Name: Eileen Mccoy MRN: [id number] Date of Birth: 01-08-1951

## 2019-04-17 ENCOUNTER — Ambulatory Visit: Payer: Medicare Other

## 2019-04-17 ENCOUNTER — Other Ambulatory Visit: Payer: Self-pay

## 2019-04-17 DIAGNOSIS — R293 Abnormal posture: Secondary | ICD-10-CM

## 2019-04-17 DIAGNOSIS — Z483 Aftercare following surgery for neoplasm: Secondary | ICD-10-CM

## 2019-04-17 DIAGNOSIS — R600 Localized edema: Secondary | ICD-10-CM

## 2019-04-17 DIAGNOSIS — M25511 Pain in right shoulder: Secondary | ICD-10-CM

## 2019-04-17 DIAGNOSIS — M6281 Muscle weakness (generalized): Secondary | ICD-10-CM

## 2019-04-17 DIAGNOSIS — M25611 Stiffness of right shoulder, not elsewhere classified: Secondary | ICD-10-CM | POA: Diagnosis not present

## 2019-04-17 NOTE — Therapy (Signed)
[place], [place], [address] Phone: [phone]   Fax:  [phone]  Physical Therapy Treatment  Patient Details  Name: Eileen Mccoy MRN: [id number] Date of Birth: [birth year] Referring Provider (PT): Lavone Neri Date: 04/17/2019  PT End of Session - 04/17/19 1207    Visit Number  3    Number of Visits  9    Date for PT Re-Evaluation  05/08/19    PT Start Time  1105    PT Stop Time  1202    PT Time Calculation (min)  57 min    Activity Tolerance  Patient tolerated treatment well    Behavior During Therapy  [facility] for tasks assessed/performed       Past Medical History:  Diagnosis Date  . Asthma 02/23/2016  . Ductal carcinoma in situ (DCIS) of right breast 01/21/2019  . GERD (gastroesophageal reflux disease)   . H. pylori infection   . Hypertension     Past Surgical History:  Procedure Laterality Date  . ABDOMINAL HYSTERECTOMY    . BIOPSY  03/16/2016   Procedure: BIOPSY;  Surgeon: Rogene Houston, MD;  [facility];  Service: Endoscopy;;  gastric biopsies  . ESOPHAGOGASTRODUODENOSCOPY N/A 03/16/2016   Procedure: ESOPHAGOGASTRODUODENOSCOPY (EGD);  Surgeon: Rogene Houston, MD;  [facility];  Service: Endoscopy;  Laterality: N/A;  3:00  . MASTECTOMY W/ SENTINEL NODE BIOPSY Right 03/11/2019   Procedure: RIGHT MASTECTOMY WITH RIGHT AXILLARY SENTINEL LYMPH NODE BIOPSY;  Surgeon: Alphonsa Overall, MD;  Location: [facility];  Service: General;  Laterality: Right;  . PARTIAL HYSTERECTOMY     1998 for fibroids  . POLYPECTOMY  03/16/2016   Procedure: POLYPECTOMY;  Surgeon: Rogene Houston, MD;  [facility];  Service: Endoscopy;;  gastric polypectomies  . THYROIDECTOMY, PARTIAL      There were no vitals filed for this visit.  Subjective Assessment - 04/17/19 1110    Subjective  I'm starting to see some improvement in the Rt axilla. I've had some new  soreness since starting therapy at my Rt chest wall but I know it's just from the new stretches. And I get measured for my compression bras after my next appt on 04/22/19.    Pertinent History  R breast cancer s/p R mastectomy and SLNB on 03/11/19    Patient Stated Goals  pt states she does not have a goal for therapy because she does not feel like she is having any trouble    Currently in Pain?  No/denies                       [facility] Adult PT Treatment/Exercise - 04/17/19 0001      Shoulder Exercises: Pulleys   Flexion  2 minutes    Flexion Limitations  VCs to slow down and for full ROM stretch    ABduction  1 minute    ABduction Limitations  Very minimal stretch felt and full ROM so stopped      Shoulder Exercises: Therapy Ball   Flexion  Both;10 reps   forward lean into end of stretch    Flexion Limitations  Pt SOB after but reports this normal as she has asthma    ABduction  Right;5 reps   side lean into end of stretch   ABduction Limitations  returning therapist demo for each ball exs      Manual Therapy   Soft tissue  mobilization  --    Myofascial Release  In the Rt axilla with abduction avoiding stress on the incision.     Manual Lymphatic Drainage (MLD)  In supine: short neck, Bil shoulder collectors, superficial and deep abdominals, Lt axillary nodes and Rt inguinal nodes, Anterior inter-axillary anastomosis, Rt axillo-inguinal anastomosis                  PT Long Term Goals - 03/27/19 1607      PT LONG TERM GOAL #1   Title  Pt will demosntrate 160 degrees of R shoulder flexion to allow her to reach items overhead.    Baseline  140    Time  4    Period  Weeks    Status  New    Target Date  05/08/19      PT LONG TERM GOAL #2   Title  Pt will demonstrate 165 degrees of right shoulder abduction to allow her to reach out to the side.    Baseline  126    Time  4    Period  Weeks    Status  New    Target Date  05/08/19      PT LONG TERM GOAL #3    Title  Pt will report a 75% improvement in R axillary swelling to decrease risk of infection    Time  4    Period  Weeks    Status  New    Target Date  05/08/19      PT LONG TERM GOAL #4   Title  Pt will be independent in a home exercise program for continued strengthening and stretching.    Time  4    Period  Weeks    Status  New    Target Date  05/08/19            Plan - 04/17/19 1208    Clinical Impression Statement  Continued with AA/ROM to Rt shoulder as pt is tolerating this very well. Some tactile cuing required for ball roll up wall for correct technique but was able to correct this. Continued with manual therapy being mindful of healing mastectomy incision and not allowing pull on that with myofascial release. Though her end P/ROM is still limited and tight, she is making good improvements and pt reports such as well.    Examination-Activity Limitations  Reach Overhead;Carry;Lift    Stability/Clinical Decision Making  Stable/Uncomplicated    Rehab Potential  Excellent    PT Frequency  2x / week    PT Duration  4 weeks    PT Treatment/Interventions  ADLs/Self Care Home Management;Therapeutic exercise;Therapeutic activities;Manual techniques;Manual lymph drainage;Compression bandaging;Scar mobilization;Passive range of motion;Taping    PT Next Visit Plan  Cont A/AA/ROM activites like ball roll on wall, and add "snow angels" with back against wall and modified downward dog; continue PROM to R shoulder, when scar is healed can do scar mobilization, MLD to R axillary swelling if still present, assess scapular series    PT Home Exercise Plan  wear chip pack over swelling, supine scapular series.    Consulted and Agree with Plan of Care  Patient       Patient will benefit from skilled therapeutic intervention in order to improve the following deficits and impairments:  Pain, Increased fascial restricitons, Decreased scar mobility, Postural dysfunction, Decreased range of  motion, Decreased strength, Impaired UE functional use, Increased edema, Decreased knowledge of precautions  Visit Diagnosis: Stiffness of right shoulder, not elsewhere classified  Acute pain of right shoulder  Aftercare following surgery for neoplasm  Localized edema  Abnormal posture  Muscle weakness (generalized)     Problem List Patient Active Problem List   Diagnosis Date Noted  . Breast cancer, stage 0, right 03/11/2019  . Ductal carcinoma in situ (DCIS) of right breast 01/21/2019  . Diverticulosis of intestine 01/02/2017  . Atherosclerosis of aorta ([place]) 01/02/2017  . Asthma in adult, mild persistent, uncomplicated [id number]  . Kidney stone 02/23/2016  . Gastroesophageal reflux disease without esophagitis 02/23/2016    Otelia Limes, PTA 04/17/2019, 12:26 PM  [place], [place], [address] Phone: [phone]   Fax:  [phone]  Name: Eileen Mccoy MRN: [id number] Date of Birth: June 05, 1950

## 2019-04-22 ENCOUNTER — Other Ambulatory Visit: Payer: Self-pay

## 2019-04-22 ENCOUNTER — Ambulatory Visit: Payer: Medicare Other | Attending: Surgery

## 2019-04-22 DIAGNOSIS — M25611 Stiffness of right shoulder, not elsewhere classified: Secondary | ICD-10-CM

## 2019-04-22 DIAGNOSIS — M25511 Pain in right shoulder: Secondary | ICD-10-CM

## 2019-04-22 DIAGNOSIS — R600 Localized edema: Secondary | ICD-10-CM

## 2019-04-22 DIAGNOSIS — Z483 Aftercare following surgery for neoplasm: Secondary | ICD-10-CM | POA: Diagnosis not present

## 2019-04-22 DIAGNOSIS — R293 Abnormal posture: Secondary | ICD-10-CM | POA: Diagnosis not present

## 2019-04-22 DIAGNOSIS — M6281 Muscle weakness (generalized): Secondary | ICD-10-CM | POA: Diagnosis not present

## 2019-04-22 DIAGNOSIS — Z9011 Acquired absence of right breast and nipple: Secondary | ICD-10-CM | POA: Diagnosis not present

## 2019-04-22 NOTE — Therapy (Signed)
[place], [place], [address] Phone: [phone]   Fax:  [phone]  Physical Therapy Treatment  Patient Details  Name: Eileen Mccoy MRN: [id number] Date of Birth: 21-Sep-1950 Referring Provider (PT): Lavone Neri Date: 04/22/2019  PT End of Session - 04/22/19 1210    Visit Number  4    Number of Visits  9    Date for PT Re-Evaluation  05/08/19    PT Start Time  1108    PT Stop Time  1206    PT Time Calculation (min)  58 min    Activity Tolerance  Patient tolerated treatment well    Behavior During Therapy  [facility] for tasks assessed/performed       Past Medical History:  Diagnosis Date  . Asthma 02/23/2016  . Ductal carcinoma in situ (DCIS) of right breast 01/21/2019  . GERD (gastroesophageal reflux disease)   . H. pylori infection   . Hypertension     Past Surgical History:  Procedure Laterality Date  . ABDOMINAL HYSTERECTOMY    . BIOPSY  03/16/2016   Procedure: BIOPSY;  Surgeon: Rogene Houston, MD;  [facility];  Service: Endoscopy;;  gastric biopsies  . ESOPHAGOGASTRODUODENOSCOPY N/A 03/16/2016   Procedure: ESOPHAGOGASTRODUODENOSCOPY (EGD);  Surgeon: Rogene Houston, MD;  [facility];  Service: Endoscopy;  Laterality: N/A;  3:00  . MASTECTOMY W/ SENTINEL NODE BIOPSY Right 03/11/2019   Procedure: RIGHT MASTECTOMY WITH RIGHT AXILLARY SENTINEL LYMPH NODE BIOPSY;  Surgeon: Alphonsa Overall, MD;  Location: [place];  Service: General;  Laterality: Right;  . PARTIAL HYSTERECTOMY     1998 for fibroids  . POLYPECTOMY  03/16/2016   Procedure: POLYPECTOMY;  Surgeon: Rogene Houston, MD;  [facility];  Service: Endoscopy;;  gastric polypectomies  . THYROIDECTOMY, PARTIAL      There were no vitals filed for this visit.  Subjective Assessment - 04/22/19 1110    Subjective  I get measured for my compression bra after session today. Overall I'm  feeling good.    Pertinent History  R breast cancer s/p R mastectomy and SLNB on 03/11/19    Patient Stated Goals  pt states she does not have a goal for therapy because she does not feel like she is having any trouble    Currently in Pain?  No/denies                       [facility] Adult PT Treatment/Exercise - 04/22/19 0001      Shoulder Exercises: Therapy Ball   Flexion  Both;10 reps   forward lean into end of stretch   Flexion Limitations  No SOB after today, pt with good technique    ABduction  Right;10 reps    ABduction Limitations  VCs for gentle stretch      Shoulder Exercises: Stretch   Wall Stretch - ABduction  5 reps   5 sec holds with back agaisnt wall for "snow angel"   Other Shoulder Stretches  Modified downward dog on wall 7x, 5 sec holds returning therapist demo      Manual Therapy   Manual Therapy  Myofascial release;Soft tissue mobilization;Manual Lymphatic Drainage (MLD);Passive ROM    Myofascial Release  In the Rt axilla during P/ROM with gentle pull being mindful of incision    Manual Lymphatic Drainage (MLD)  In supine: short neck, Bil shoulder collectors, superficial and 5 diaphragmatic breaths, Lt axillary  nodes and Rt inguinal nodes, Anterior inter-axillary anastomosis, Rt axillo-inguinal anastomosis then focused on Rt lateral trunk and anterior inferior to incison where fullness palpable, rediting more towards axillo-inguinal anastomosis.    Passive ROM  In Supine to Rt shoulder into flexion, abduction and D2 to pts tolerance                  PT Long Term Goals - 03/27/19 1607      PT LONG TERM GOAL #1   Title  Pt will demosntrate 160 degrees of R shoulder flexion to allow her to reach items overhead.    Baseline  140    Time  4    Period  Weeks    Status  New    Target Date  05/08/19      PT LONG TERM GOAL #2   Title  Pt will demonstrate 165 degrees of right shoulder abduction to allow her to reach out to the side.    Baseline   126    Time  4    Period  Weeks    Status  New    Target Date  05/08/19      PT LONG TERM GOAL #3   Title  Pt will report a 75% improvement in R axillary swelling to decrease risk of infection    Time  4    Period  Weeks    Status  New    Target Date  05/08/19      PT LONG TERM GOAL #4   Title  Pt will be independent in a home exercise program for continued strengthening and stretching.    Time  4    Period  Weeks    Status  New    Target Date  05/08/19            Plan - 04/22/19 1211    Clinical Impression Statement  Progressed [name] active stretching today, see flowsheet, and she tolerated this well reporting good stretches felt. Continued with manual therapy including manual lymph drainage to anterior jnferior trunk where palpable fullness present. Pt also reports she has been noticing increased amount of swelling here since surgery, but only on Rt. Reviewed with her lymphatic watersheds and how this area of her trunk is still part of the effected area. This did seem to soften some during session today. Was slightly more aggresive with myofascial release at axilla during P/ROM but still being mindful of not having too much pull at incision. Pt tolerated all well.    Examination-Activity Limitations  Reach Overhead;Carry;Lift    Stability/Clinical Decision Making  Stable/Uncomplicated    Rehab Potential  Excellent    PT Frequency  2x / week    PT Duration  4 weeks    PT Treatment/Interventions  ADLs/Self Care Home Management;Therapeutic exercise;Therapeutic activities;Manual techniques;Manual lymph drainage;Compression bandaging;Scar mobilization;Passive range of motion;Taping    PT Next Visit Plan  Remeasure A/ROM next. Cont A/AA/ROM activites like ball roll on wall, "snow angels" with back against wall and modified downward dog; continue PROM to R shoulder, when scar is healed can do scar mobilization, MLD to R axillary and anterior inferior trunk and inferior to incision,  assess scapular series and issue red theraband    PT Home Exercise Plan  wear chip pack over swelling, supine scapular series.    Consulted and Agree with Plan of Care  Patient       Patient will benefit from skilled therapeutic intervention in order to improve  the following deficits and impairments:  Pain, Increased fascial restricitons, Decreased scar mobility, Postural dysfunction, Decreased range of motion, Decreased strength, Impaired UE functional use, Increased edema, Decreased knowledge of precautions  Visit Diagnosis: Stiffness of right shoulder, not elsewhere classified  Acute pain of right shoulder  Aftercare following surgery for neoplasm  Localized edema  Abnormal posture  Muscle weakness (generalized)     Problem List Patient Active Problem List   Diagnosis Date Noted  . Breast cancer, stage 0, right 03/11/2019  . Ductal carcinoma in situ (DCIS) of right breast 01/21/2019  . Diverticulosis of intestine 01/02/2017  . Atherosclerosis of aorta ([place]) 01/02/2017  . Asthma in adult, mild persistent, uncomplicated [id number]  . Kidney stone 02/23/2016  . Gastroesophageal reflux disease without esophagitis 02/23/2016    Otelia Limes, PTA 04/22/2019, 12:20 PM  [place], [place], [address] Phone: [phone]   Fax:  [phone]  Name: Eileen Mccoy MRN: [id number] Date of Birth: 1950/10/01

## 2019-04-24 ENCOUNTER — Ambulatory Visit: Payer: Medicare Other

## 2019-04-24 ENCOUNTER — Other Ambulatory Visit: Payer: Self-pay

## 2019-04-24 DIAGNOSIS — R293 Abnormal posture: Secondary | ICD-10-CM

## 2019-04-24 DIAGNOSIS — R600 Localized edema: Secondary | ICD-10-CM

## 2019-04-24 DIAGNOSIS — Z483 Aftercare following surgery for neoplasm: Secondary | ICD-10-CM

## 2019-04-24 DIAGNOSIS — M25611 Stiffness of right shoulder, not elsewhere classified: Secondary | ICD-10-CM

## 2019-04-24 DIAGNOSIS — M6281 Muscle weakness (generalized): Secondary | ICD-10-CM | POA: Diagnosis not present

## 2019-04-24 DIAGNOSIS — M25511 Pain in right shoulder: Secondary | ICD-10-CM

## 2019-04-24 NOTE — Therapy (Addendum)
[place], [place], [address] Phone: [phone]   Fax:  [phone]  Physical Therapy Treatment  Patient Details  Name: Eileen Mccoy MRN: [id number] Date of Birth: 04/25/1950 Referring Provider (PT): [name]   Encounter Date: 04/24/2019  PT End of Session - 04/24/19 1126    Visit Number  5    Number of Visits  9    Date for PT Re-Evaluation  05/08/19    PT Start Time  1103    PT Stop Time  1200    PT Time Calculation (min)  57 min    Activity Tolerance  Patient tolerated treatment well    Behavior During Therapy  [facility] for tasks assessed/performed       Past Medical History:  Diagnosis Date  . Asthma 02/23/2016  . Ductal carcinoma in situ (DCIS) of right breast 01/21/2019  . GERD (gastroesophageal reflux disease)   . H. pylori infection   . Hypertension     Past Surgical History:  Procedure Laterality Date  . ABDOMINAL HYSTERECTOMY    . BIOPSY  03/16/2016   Procedure: BIOPSY;  Surgeon: Rogene Houston, MD;  [facility];  Service: Endoscopy;;  gastric biopsies  . ESOPHAGOGASTRODUODENOSCOPY N/A 03/16/2016   Procedure: ESOPHAGOGASTRODUODENOSCOPY (EGD);  Surgeon: Rogene Houston, MD;  [facility];  Service: Endoscopy;  Laterality: N/A;  3:00  . MASTECTOMY W/ SENTINEL NODE BIOPSY Right 03/11/2019   Procedure: RIGHT MASTECTOMY WITH RIGHT AXILLARY SENTINEL LYMPH NODE BIOPSY;  Surgeon: Alphonsa Overall, MD;  Location: [place];  Service: General;  Laterality: Right;  . PARTIAL HYSTERECTOMY     1998 for fibroids  . POLYPECTOMY  03/16/2016   Procedure: POLYPECTOMY;  Surgeon: Rogene Houston, MD;  [facility];  Service: Endoscopy;;  gastric polypectomies  . THYROIDECTOMY, PARTIAL      There were no vitals filed for this visit.  Subjective Assessment - 04/24/19 1106    Subjective  I'm wearing my new bra with my prosthesis so my Rt chest is a little  irritated just from that. Other than that I feel good and felt good after lat session.    Pertinent History  R breast cancer s/p R mastectomy and SLNB on 03/11/19    Patient Stated Goals  pt states she does not have a goal for therapy because she does not feel like she is having any trouble    Currently in Pain?  No/denies         [facility] PT Assessment - 04/24/19 0001      AROM   Right Shoulder Flexion  160 Degrees    Right Shoulder ABduction  164 Degrees    Right Shoulder Internal Rotation  62 Degrees                   [facility] Adult PT Treatment/Exercise - 04/24/19 0001      Shoulder Exercises: Supine   Horizontal ABduction  Strengthening;Both;5 reps;Theraband    Theraband Level (Shoulder Horizontal ABduction)  Level 2 (Red)    Horizontal ABduction Limitations  Tactile cues and demo for correct UE techique and position    External Rotation  Strengthening;Both;10 reps;Theraband    Theraband Level (Shoulder External Rotation)  Level 2 (Red)    External Rotation Limitations  VCs during to keep Rt elbow at her side    Flexion  Strengthening;Both;10 reps;Theraband   Narrow and Wide Grip, 10x each   Theraband Level (Shoulder Flexion)  Level 2 (Red)    Diagonals  Strengthening;Right;Left;5 reps;Theraband    Theraband Level (Shoulder Diagonals)  Level 2 (Red)    Diagonals Limitations  VCs for full end ROM with Rt UE      Shoulder Exercises: Therapy Ball   Flexion  Both;10 reps   forward lean into end of stretch   ABduction  Right;10 reps   same side lean into end of stretch     Shoulder Exercises: Stretch   Corner Stretch  5 reps;10 seconds   in doorway returning therapist demo   Wall Stretch - ABduction  Other (comment)   10x, 5 sec holds     Manual Therapy   Myofascial Release  In the Rt axilla during P/ROM with gentle pull    Manual Lymphatic Drainage (MLD)  In supine: short neck, Bil shoulder collectors, superficial and 5 diaphragmatic breaths, Lt axillary nodes and  Rt inguinal nodes, Anterior inter-axillary anastomosis, Rt axillo-inguinal anastomosis then focused on Rt lateral trunk and anterior inferior to incison where fullness palpable, redirecting more towards axillo-inguinal anastomosis.    Passive ROM  In Supine to Rt shoulder into flexion, abduction and D2 to pts tolerance                  PT Long Term Goals - 04/24/19 1207      PT LONG TERM GOAL #1   Title  Pt will demosntrate 160 degrees of R shoulder flexion to allow her to reach items overhead.    Baseline  140; 164 degrees - 04/24/19    Status  Achieved      PT LONG TERM GOAL #2   Title  Pt will demonstrate 165 degrees of right shoulder abduction to allow her to reach out to the side.    Baseline  126; 164 degrees - 04/24/19    Status  Partially Met      PT LONG TERM GOAL #3   Title  Pt will report a 75% improvement in R axillary swelling to decrease risk of infection    Baseline  Pt reports has noticed some improvement with axillary swelling since start of care but did not rate-04/24/19    Status  On-going      PT LONG TERM GOAL #4   Title  Pt will be independent in a home exercise program for continued strengthening and stretching.    Baseline  Progressed her HEP from yellow to red theraband, also working on progressing stretches as she is now 6 weeks from surgery-04/24/19    Status  On-going            Plan - 04/24/19 1126    Clinical Impression Statement  Added doorway pectoralis stretch today which pt tolerated well. Also progressed her HEP to include red theraband in place of yellow after having her perform to determine she can tolerate new resistance well. She does still require cuing for correct UE technique and positioning though so will benefit from further review. Continued with manual therapy working to improve P/ROM and decrease axillary swelling. Pt reports was fitted for bras and received a prosthesis to wear. They are ABC bras and appear to have some  compression, however they do not provide near axillary or back coverage but she has already washed all 6 bras she received so can not return for other compression bras if it's deemed warranted. Pt reports will be able to get more in 3 months. Remeasured her A/ROM and this has improved well since evaluation having  met flexion goal and almost met abduction goal. Overall pt is progressing well thus far.    Examination-Activity Limitations  Reach Overhead;Carry;Lift    Stability/Clinical Decision Making  Stable/Uncomplicated    Rehab Potential  Excellent    PT Frequency  2x / week    PT Duration  4 weeks    PT Treatment/Interventions  ADLs/Self Care Home Management;Therapeutic exercise;Therapeutic activities;Manual techniques;Manual lymph drainage;Compression bandaging;Scar mobilization;Passive range of motion;Taping    PT Next Visit Plan  Cont A/AA/ROM activites like ball roll on wall, "snow angels" with back against wall and modified downward dog; continue PROM to R shoulder, when scar is healed can do scar mobilization, MLD to R axillary and anterior inferior trunk and inferior to incision    PT Home Exercise Plan  wear chip pack over swelling, supine scapular series.    Consulted and Agree with Plan of Care  Patient       Patient will benefit from skilled therapeutic intervention in order to improve the following deficits and impairments:  Pain, Increased fascial restricitons, Decreased scar mobility, Postural dysfunction, Decreased range of motion, Decreased strength, Impaired UE functional use, Increased edema, Decreased knowledge of precautions  Visit Diagnosis: Stiffness of right shoulder, not elsewhere classified  Acute pain of right shoulder  Aftercare following surgery for neoplasm  Localized edema  Abnormal posture  Muscle weakness (generalized)     Problem List Patient Active Problem List   Diagnosis Date Noted  . Breast cancer, stage 0, right 03/11/2019  . Ductal  carcinoma in situ (DCIS) of right breast 01/21/2019  . Diverticulosis of intestine 01/02/2017  . Atherosclerosis of aorta ([place]) 01/02/2017  . Asthma in adult, mild persistent, uncomplicated 76/18/4859  . Kidney stone 02/23/2016  . Gastroesophageal reflux disease without esophagitis 02/23/2016    Otelia Limes, PTA 04/24/2019, 12:14 PM  [place], [place], [address] Phone: [phone]   Fax:  [phone]  Name: Eileen Mccoy MRN: [id number] Date of Birth: 06/11/1950

## 2019-04-29 ENCOUNTER — Ambulatory Visit: Payer: Medicare Other

## 2019-04-29 ENCOUNTER — Other Ambulatory Visit: Payer: Self-pay

## 2019-04-29 DIAGNOSIS — R293 Abnormal posture: Secondary | ICD-10-CM | POA: Diagnosis not present

## 2019-04-29 DIAGNOSIS — M6281 Muscle weakness (generalized): Secondary | ICD-10-CM | POA: Diagnosis not present

## 2019-04-29 DIAGNOSIS — M25611 Stiffness of right shoulder, not elsewhere classified: Secondary | ICD-10-CM | POA: Diagnosis not present

## 2019-04-29 DIAGNOSIS — R600 Localized edema: Secondary | ICD-10-CM

## 2019-04-29 DIAGNOSIS — Z483 Aftercare following surgery for neoplasm: Secondary | ICD-10-CM

## 2019-04-29 DIAGNOSIS — M25511 Pain in right shoulder: Secondary | ICD-10-CM

## 2019-04-29 NOTE — Therapy (Signed)
[place], [place], [address] Phone: [phone]   Fax:  [phone]  Physical Therapy Treatment  Patient Details  Name: Eileen Mccoy MRN: [id number] Date of Birth: [birth year] Referring Provider (PT): Lavone Neri Date: 04/29/2019  PT End of Session - 04/29/19 1217    Visit Number  6    Number of Visits  9    Date for PT Re-Evaluation  05/08/19    PT Start Time  8270    PT Stop Time  1207    PT Time Calculation (min)  62 min    Activity Tolerance  Patient tolerated treatment well    Behavior During Therapy  [facility] for tasks assessed/performed       Past Medical History:  Diagnosis Date  . Asthma 02/23/2016  . Ductal carcinoma in situ (DCIS) of right breast 01/21/2019  . GERD (gastroesophageal reflux disease)   . H. pylori infection   . Hypertension     Past Surgical History:  Procedure Laterality Date  . ABDOMINAL HYSTERECTOMY    . BIOPSY  03/16/2016   Procedure: BIOPSY;  Surgeon: Rogene Houston, MD;  [facility];  Service: Endoscopy;;  gastric biopsies  . ESOPHAGOGASTRODUODENOSCOPY N/A 03/16/2016   Procedure: ESOPHAGOGASTRODUODENOSCOPY (EGD);  Surgeon: Rogene Houston, MD;  [facility];  Service: Endoscopy;  Laterality: N/A;  3:00  . MASTECTOMY W/ SENTINEL NODE BIOPSY Right 03/11/2019   Procedure: RIGHT MASTECTOMY WITH RIGHT AXILLARY SENTINEL LYMPH NODE BIOPSY;  Surgeon: Alphonsa Overall, MD;  Location: [place];  Service: General;  Laterality: Right;  . PARTIAL HYSTERECTOMY     1998 for fibroids  . POLYPECTOMY  03/16/2016   Procedure: POLYPECTOMY;  Surgeon: Rogene Houston, MD;  [facility];  Service: Endoscopy;;  gastric polypectomies  . THYROIDECTOMY, PARTIAL      There were no vitals filed for this visit.  Subjective Assessment - 04/29/19 1114    Subjective  I was sore after last visit but I think it's from my new bra and the  prosthesis rubbing my chest. I'm goin gto call to see if I can get a compression bra from there ([facility] [employer] [facility]) too.    Pertinent History  R breast cancer s/p R mastectomy and SLNB on 03/11/19    Patient Stated Goals  pt states she does not have a goal for therapy because she does not feel like she is having any trouble    Currently in Pain?  No/denies                       [facility] Adult PT Treatment/Exercise - 04/29/19 0001      Shoulder Exercises: Standing   Other Standing Exercises  Bil UE 3 way raises, 1 lb weights on wrist, x10 each into flexion, scaption, abduction returning therapist demo of back against wall with core engaged, and head/sholders against wall for erect posture      Shoulder Exercises: Therapy Ball   Flexion  Both;10 reps   forward lean into end of stretch with 1 lb on wrists   ABduction  Right;10 reps   same side lean into end of stretch with 1 lb on Rt wrist     Manual Therapy   Manual Therapy  Myofascial release;Manual Lymphatic Drainage (MLD);Passive ROM    Myofascial Release  In the Rt axilla during P/ROM    Manual Lymphatic Drainage (MLD)  In supine: short  neck, Bil shoulder collectors, superficial and deep abdominals, Lt axillary nodes and Rt inguinal nodes, Anterior inter-axillary anastomosis, Rt axillo-inguinal anastomosis then focused on Rt lateral trunk and anterior inferior to incison where fullness palpable, redirecting more towards axillo-inguinal anastomosis.    Passive ROM  In Supine to Rt shoulder into flexion and abduction to pts tolerance             PT Education - 04/29/19 1214    Education Details  Standing bil UE 3 way raises with 1 lb and instructed pt in "low and slow" progression of resistance with exs to keep risk of lymphedema progression low    Person(s) Educated  Patient    Methods  Explanation;Demonstration;Handout    Comprehension  Verbalized understanding;Returned demonstration          PT Long Term Goals  - 04/24/19 1207      PT LONG TERM GOAL #1   Title  Pt will demosntrate 160 degrees of R shoulder flexion to allow her to reach items overhead.    Baseline  140; 164 degrees - 04/24/19    Status  Achieved      PT LONG TERM GOAL #2   Title  Pt will demonstrate 165 degrees of right shoulder abduction to allow her to reach out to the side.    Baseline  126; 164 degrees - 04/24/19    Status  Partially Met      PT LONG TERM GOAL #3   Title  Pt will report a 75% improvement in R axillary swelling to decrease risk of infection    Baseline  Pt reports has noticed some improvement with axillary swelling since start of care but did not rate-04/24/19    Status  On-going      PT LONG TERM GOAL #4   Title  Pt will be independent in a home exercise program for continued strengthening and stretching.    Baseline  Progressed her HEP from yellow to red theraband, also working on progressing stretches as she is now 6 weeks from surgery-04/24/19    Status  On-going            Plan - 04/29/19 1218    Clinical Impression Statement  Overall pt is progressing very well and discussed progress with her thus far. She also reports noticing good improvements overall with increased use and function of her Rt shoulder/arm and less axillary swelling. She also wore a new compression bra her daughter had bought for her and these provided fuller coverage than bra worn at last session across back and closer to axilla. Pt reports these more comfortable as well. She would like to cancel her Thurs visit, come Tues and possibly D/C then. Progressed HEP today to include bil UE shoulder strength with 3 way raises. She reports feeling challneged by these, but reasonably so tolerating them well. Also continued with manual therapy working to progress her end Rt shoulder P/ROM and decrease axillary swelling.    Examination-Activity Limitations  Reach Overhead;Carry;Lift    Stability/Clinical Decision Making  Stable/Uncomplicated     Rehab Potential  Excellent    PT Frequency  2x / week    PT Duration  4 weeks    PT Treatment/Interventions  ADLs/Self Care Home Management;Therapeutic exercise;Therapeutic activities;Manual techniques;Manual lymph drainage;Compression bandaging;Scar mobilization;Passive range of motion;Taping    PT Next Visit Plan  Possible D/C at next session. Reassess goals and take measurements per goals. Review HEPs issued thus far. Cont A/AA/P/ROM stretching of Rt shoulder. Try  scar mobilization and teach pt same.    PT Home Exercise Plan  wear chip pack over swelling, supine scapular series; bil UE standing 3 way raises    Consulted and Agree with Plan of Care  Patient       Patient will benefit from skilled therapeutic intervention in order to improve the following deficits and impairments:  Pain, Increased fascial restricitons, Decreased scar mobility, Postural dysfunction, Decreased range of motion, Decreased strength, Impaired UE functional use, Increased edema, Decreased knowledge of precautions  Visit Diagnosis: Stiffness of right shoulder, not elsewhere classified  Acute pain of right shoulder  Aftercare following surgery for neoplasm  Localized edema  Abnormal posture  Muscle weakness (generalized)     Problem List Patient Active Problem List   Diagnosis Date Noted  . Breast cancer, stage 0, right 03/11/2019  . Ductal carcinoma in situ (DCIS) of right breast 01/21/2019  . Diverticulosis of intestine 01/02/2017  . Atherosclerosis of aorta ([place]) 01/02/2017  . Asthma in adult, mild persistent, uncomplicated 88/26/6664  . Kidney stone 02/23/2016  . Gastroesophageal reflux disease without esophagitis 02/23/2016    Otelia Limes, PTA 04/29/2019, 12:28 PM  [place], [place], [address] Phone: [phone]   Fax:  [phone]  Name: Eileen Mccoy MRN: [id number] Date of Birth:  Sep 17, 1950

## 2019-04-29 NOTE — Patient Instructions (Signed)

## 2019-05-01 ENCOUNTER — Ambulatory Visit: Payer: Medicare Other

## 2019-05-03 ENCOUNTER — Ambulatory Visit: Payer: Medicare Other

## 2019-05-03 ENCOUNTER — Ambulatory Visit: Payer: Medicare Other | Attending: Internal Medicine

## 2019-05-03 DIAGNOSIS — Z23 Encounter for immunization: Secondary | ICD-10-CM

## 2019-05-03 NOTE — Progress Notes (Signed)
   Covid-19 Vaccination Clinic  Name:  Locklynn Huval    MRN: [id number] DOB: [birth year]  05/03/2019  Ms. Salmonson was observed post Covid-19 immunization for 15 minutes without incident. She was provided with Vaccine Information Sheet and instruction to access the V-Safe system.   Ms. Deline was instructed to call 911 with any severe reactions post vaccine: Marland Kitchen Difficulty breathing  . Swelling of face and throat  . A fast heartbeat  . A bad rash all over body  . Dizziness and weakness   Immunizations Administered    Name Date Dose VIS Date Route   Pfizer COVID-19 Vaccine 05/03/2019 11:18 AM 0.3 mL 01/31/2019 Intramuscular   Manufacturer: [place]   Lot: [id number]   [place]: [id number]

## 2019-05-06 ENCOUNTER — Ambulatory Visit: Payer: Medicare Other

## 2019-05-06 ENCOUNTER — Other Ambulatory Visit: Payer: Self-pay

## 2019-05-06 DIAGNOSIS — R600 Localized edema: Secondary | ICD-10-CM

## 2019-05-06 DIAGNOSIS — R293 Abnormal posture: Secondary | ICD-10-CM | POA: Diagnosis not present

## 2019-05-06 DIAGNOSIS — Z483 Aftercare following surgery for neoplasm: Secondary | ICD-10-CM

## 2019-05-06 DIAGNOSIS — M25511 Pain in right shoulder: Secondary | ICD-10-CM

## 2019-05-06 DIAGNOSIS — M6281 Muscle weakness (generalized): Secondary | ICD-10-CM

## 2019-05-06 DIAGNOSIS — M25611 Stiffness of right shoulder, not elsewhere classified: Secondary | ICD-10-CM

## 2019-05-06 NOTE — Therapy (Addendum)
[facility] [address] [place], [place], [address] Phone: [phone]   Fax:  [phone]  Physical Therapy Discharge Note  Patient Details  Name: Eileen Mccoy MRN: [id number] Date of Birth: 10/21/1950 Referring Provider (PT): Newman   Encounter Date: 05/06/2019  PT End of Session - 05/06/19 1207    Visit Number  7    Number of Visits  9    Date for PT Re-Evaluation  05/08/19    PT Start Time  1109    PT Stop Time  1206    PT Time Calculation (min)  57 min    Activity Tolerance  Patient tolerated treatment well    Behavior During Therapy  [facility] for tasks assessed/performed       Past Medical History:  Diagnosis Date  . Asthma 02/23/2016  . Ductal carcinoma in situ (DCIS) of right breast 01/21/2019  . GERD (gastroesophageal reflux disease)   . H. pylori infection   . Hypertension     Past Surgical History:  Procedure Laterality Date  . ABDOMINAL HYSTERECTOMY    . BIOPSY  03/16/2016   Procedure: BIOPSY;  Surgeon: Najeeb U [name], MD;  [facility];  Service: Endoscopy;;  gastric biopsies  . ESOPHAGOGASTRODUODENOSCOPY N/A 03/16/2016   Procedure: ESOPHAGOGASTRODUODENOSCOPY (EGD);  Surgeon: Najeeb U [name], MD;  [facility];  Service: Endoscopy;  Laterality: N/A;  3:00  . MASTECTOMY W/ SENTINEL NODE BIOPSY Right 03/11/2019   Procedure: RIGHT MASTECTOMY WITH RIGHT AXILLARY SENTINEL LYMPH NODE BIOPSY;  Surgeon: Newman, David, MD;  Location: [facility];  Service: General;  Laterality: Right;  . PARTIAL HYSTERECTOMY     1998 for fibroids  . POLYPECTOMY  03/16/2016   Procedure: POLYPECTOMY;  Surgeon: Najeeb U [name], MD;  [facility];  Service: Endoscopy;;  gastric polypectomies  . THYROIDECTOMY, PARTIAL      There were no vitals filed for this visit.  Subjective Assessment - 05/06/19 1112    Subjective  I had my second COVID vaccine Saturday so I felt like crap on Sunday.  Just the general malaise and flu like symptoms. But feel better today and ready to make today my last visit.    Pertinent History  R breast cancer s/p R mastectomy and SLNB on 03/11/19    Patient Stated Goals  pt states she does not have a goal for therapy because she does not feel like she is having any trouble    Currently in Pain?  No/denies   Lt shoulder still sore from shot                      [facility] Adult PT Treatment/Exercise - 05/06/19 0001      Shoulder Exercises: Standing   Other Standing Exercises  Bil UE 3 way raises, 1 lb weights, x10 each into flexion, scaption, abduction returning therapist demo of back against wall with core engaged, and head/sholders against wall for erect posture      Shoulder Exercises: Therapy Ball   Flexion  Both;10 reps   Forward lean into end of stretch   ABduction  Right;5 reps   Same side lean into end of stretch     Shoulder Exercises: Stretch   Corner Stretch  5 reps;10 seconds      Manual Therapy   Myofascial Release  In the Rt axilla during P/ROM and instructed pt in scar mobilizations while performing and having her then return demo using fingertips along   scar.    Passive ROM  In Supine to Rt shoulder into flexion, abduction, D2 and er/IR to pts end ROM             PT Education - 05/06/19 1126    Education Details  Reviewed standing bil UE 3 way raises; also doorway pectoralis and Rt shoulder stretches for end ROM    Person(s) Educated  Patient    Methods  Explanation;Demonstration;Handout   for doorway stretches   Comprehension  Verbalized understanding;Returned demonstration          PT Long Term Goals - 05/06/19 1115      PT LONG TERM GOAL #1   Title  Pt will demosntrate 160 degrees of R shoulder flexion to allow her to reach items overhead.    Baseline  140; 164 degrees - 04/24/19; 166 degrees- 05/06/19    Status  Achieved      PT LONG TERM GOAL #2   Title  Pt will demonstrate 165 degrees of right  shoulder abduction to allow her to reach out to the side.    Baseline  126; 164 degrees - 04/24/19; 168 degrees-05/06/19    Status  Achieved      PT LONG TERM GOAL #3   Title  Pt will report a 75% improvement in R axillary swelling to decrease risk of infection    Baseline  Pt reports has noticed some improvement with axillary swelling since start of care but did not rate-04/24/19; 90% improvement reported at this time-05/06/19    Status  Achieved      PT LONG TERM GOAL #4   Title  Pt will be independent in a home exercise program for continued strengthening and stretching.    Baseline  Progressed her HEP from yellow to red theraband, also working on progressing stretches as she is now 6 weeks from surgery-04/24/19; pt is independent with current HEP and progressed to add bil UE 3 way raises as well-05/06/19    Status  Achieved            Plan - 05/06/19 1237    Clinical Impression Statement  Pt has met all goals and is ready for D/C at this time. Reviewed HEP she currently has and pt is doing well with good technique. Instructed her today in scar mobilization/massage as she is now 6 weeks out. Pt was able to return very good demonstration with correct light technique.    Examination-Activity Limitations  Reach Overhead;Carry;Lift    Stability/Clinical Decision Making  Stable/Uncomplicated    Rehab Potential  Excellent    PT Frequency  2x / week    PT Duration  4 weeks    PT Treatment/Interventions  ADLs/Self Care Home Management;Therapeutic exercise;Therapeutic activities;Manual techniques;Manual lymph drainage;Compression bandaging;Scar mobilization;Passive range of motion;Taping    PT Next Visit Plan  D/C this visit.    PT Home Exercise Plan  wear chip pack over swelling, supine scapular series; bil UE standing 3 way raises; doorway stretch and scar mobs    Consulted and Agree with Plan of Care  Patient       Patient will benefit from skilled therapeutic intervention in order to improve  the following deficits and impairments:  Pain, Increased fascial restricitons, Decreased scar mobility, Postural dysfunction, Decreased range of motion, Decreased strength, Impaired UE functional use, Increased edema, Decreased knowledge of precautions  Visit Diagnosis: Stiffness of right shoulder, not elsewhere classified  Acute pain of right shoulder  Aftercare following surgery for neoplasm  Localized edema    Abnormal posture  Muscle weakness (generalized)     Problem List Patient Active Problem List   Diagnosis Date Noted  . Breast cancer, stage 0, right 03/11/2019  . Ductal carcinoma in situ (DCIS) of right breast 01/21/2019  . Diverticulosis of intestine 01/02/2017  . Atherosclerosis of aorta ([place]) 01/02/2017  . Asthma in adult, mild persistent, uncomplicated 38/45/3646  . Kidney stone 02/23/2016  . Gastroesophageal reflux disease without esophagitis 02/23/2016   PHYSICAL THERAPY DISCHARGE SUMMARY  Plan: Patient agrees to discharge.  Patient goals were met. Patient is being discharged due to meeting the stated rehab goals.  ?????       Otelia Limes, PTA 05/06/2019, 1:11 PM  Tomma Rakers, PT 05/06/19 1:34 PM  [place], [place], [address] Phone: [phone]   Fax:  [phone]  Name: Dariah Mcsorley MRN: [id number] Date of Birth: 03/30/1950

## 2019-05-06 NOTE — Patient Instructions (Addendum)
CHEST: Doorway, Bilateral - Standing    Standing in doorway with one foot in front of other, place hands on wall with elbows bent at shoulder height. Lean forward. Hold _20__ seconds. _3__ reps per set, _3-5__ sets per day.  Also, slide just Rt arm up doorway for Rt shoulder stretch. Remember to also turn away from doorway to find other areas of tightness.   Cancer Rehab [phone]  Copyright  VHI. All rights reserved.

## 2019-05-08 ENCOUNTER — Ambulatory Visit: Payer: Medicare Other

## 2019-05-13 ENCOUNTER — Ambulatory Visit: Payer: Medicare Other

## 2019-05-18 NOTE — Progress Notes (Signed)
[place]  [phone] [phone]     ID: Eileen Mccoy DOB: [birth year]  MR#: [id number]  [id number]  Patient Care Team: Celene Squibb, MD as PCP - General (Internal Medicine) Marshell Garfinkel, MD as Consulting Physician (Pulmonary Disease) Breaunna Gottlieb, Virgie Dad, MD as Consulting Physician (Oncology) Rogene Houston, MD as Consulting Physician (Gastroenterology) Alphonsa Overall, MD as Consulting Physician (General Surgery) Eppie Gibson, MD as Attending Physician (Radiation Oncology) Brand Males, MD as Consulting Physician (Pulmonary Disease) Mauro Kaufmann, RN as Oncology Nurse Navigator Rockwell Germany, RN as Oncology Nurse Navigator Chauncey Cruel, MD OTHER MD:  CHIEF COMPLAINT: 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 She was evaluated in the breast cancer clinic on 01/21/2019.  Since consultation, she underwent breast MRI on 01/31/2019 showing: breast composition C; patchy non-mass enhancement in right breast, some of which corresponds to the two biopsied sites, which extends more anteriorly and inferiorly in total measuring approximately 6 cm; no specific MRI evidence of malignancy in the left breast; no abnormal-appearing lymph nodes.  She proceeded to biopsy of the anterior extent of the non-mass enhancement in the right breast on 02/11/2019. Pathology [id number]) again showed ductal carcinoma in situ with calcifications and necrosis, high grade. prognostic indicators significant for: estrogen receptor, 95% positive with strong staining intensity and progesterone receptor, 40% positive with moderate staining intensity.  She opted to proceed with right mastectomy and right sentinel lymph node biopsy on 03/10/2018 under Dr. [name]. Pathology from the procedure ([id number]) showed: high grade ductal  carcinoma in situ, 2.4 cm; margins uninvolved.   All four biopsied lymph nodes were negative (0/4).  Of note, her most recent bone density screening took place on 11/06/2018 at [facility]. This showed a T-score of -0.7, which is considered normal.   REVIEW OF SYSTEMS: Eileen Mccoy did well with her surgery, with minimal pain, no bleeding, no fever.  She has had both her COVID-19 shots.  She did well with those as well.  She is taking walks for exercise.  She has no significant pain soreness or shooting pains in the surgical site.  She does not want reconstruction and is using prostheses appropriately.  A detailed review of systems was otherwise stable.   HISTORY OF CURRENT ILLNESS: From the original intake note:  Eileen Mccoy had routine screening mammography at [facility] in [place] showing a possible abnormality in the right breast.  We do not have copies of those or subsequent studies, but from the radiology review at conference it appears that she has a 6 cm area of calcifications in the upper outer quadrant of the breast.  No axillary ultrasound was obtained.  2 biopsies were obtained 01/08/2019 with the clips 3.4 cm apart.   The pathology from this procedure [id number]) showed:  1. Right Breast, lateral, near 9 o'clock  - ductal carcinoma with necrosis and calcifications, intermediate to high grade  -  prognostic indicators significant for: estrogen receptor, 90% positive and progesterone receptor, 20% positive, both with strong staining intensity. 2. Right Breast, lower-outer, posterior and medial to #1   - ductal carcinoma with necrosis and calcifications, intermediate to high grade  - prognostic indicators significant for: estrogen receptor, 100% positive with strong staining intensity, and progesterone receptor, 0% negative.   The patient's subsequent history is as detailed below.   PAST MEDICAL HISTORY: Past Medical History:  Diagnosis Date  .  Asthma 02/23/2016  .  Ductal carcinoma in situ (DCIS) of right breast 01/21/2019  . GERD (gastroesophageal reflux disease)   . H. pylori infection   . Hypertension     PAST SURGICAL HISTORY: Past Surgical History:  Procedure Laterality Date  . ABDOMINAL HYSTERECTOMY    . BIOPSY  03/16/2016   Procedure: BIOPSY;  Surgeon: Rogene Houston, MD;  [facility];  Service: Endoscopy;;  gastric biopsies  . ESOPHAGOGASTRODUODENOSCOPY N/A 03/16/2016   Procedure: ESOPHAGOGASTRODUODENOSCOPY (EGD);  Surgeon: Rogene Houston, MD;  [facility];  Service: Endoscopy;  Laterality: N/A;  3:00  . MASTECTOMY W/ SENTINEL NODE BIOPSY Right 03/11/2019   Procedure: RIGHT MASTECTOMY WITH RIGHT AXILLARY SENTINEL LYMPH NODE BIOPSY;  Surgeon: Alphonsa Overall, MD;  Location: [facility];  Service: General;  Laterality: Right;  . PARTIAL HYSTERECTOMY     1998 for fibroids  . POLYPECTOMY  03/16/2016   Procedure: POLYPECTOMY;  Surgeon: Rogene Houston, MD;  [facility];  Service: Endoscopy;;  gastric polypectomies  . THYROIDECTOMY, PARTIAL      FAMILY HISTORY: Family History  Problem Relation Age of Onset  . Heart disease Mother   . Asthma Mother   . COPD Mother 81       COPD  . Cancer Father   . Alcohol abuse Father   . COPD Sister 75  . Alcohol abuse Brother   . Heart disease Sister 64   Patient's father was 56 years old when he died from alcoholism. Patient's mother died from myocardial infarction at age 53. The patient denies a family hx of breast or ovarian cancer. She has 12 siblings, 8 sisters and 4 brothers.  She is not aware of anyone in the family having breast cancer.   GYNECOLOGIC HISTORY:  Patient's last menstrual period was 02/21/1976 (approximate). Menarche: 69 years old Age at first live birth: 69 years old [place] P 2 HRT 2 years  Hysterectomy? Yes, in the 1980's BSO?  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  She  also worked in a factory for many years.. Her husband Hendricks Milo is retired from [employer]. Daughter [name], age 25, works as a Radio producer in [place], [place]. Daughter Angelica, [name]") age 61 also lives in [place] and works in a bank.  The patient has 2 grandchildren.  She attends a [employer].    ADVANCED DIRECTIVES: In the absence of any documents to the contrary the patient's husband is a healthcare power of attorney   HEALTH MAINTENANCE: Social History   Tobacco Use  . Smoking status: Never Smoker  . Smokeless tobacco: Never Used  Substance Use Topics  . Alcohol use: Yes    Comment: occasionally  . Drug use: No     Colonoscopy:   PAP: none on file, s/p hysterectomy  Bone density: 10/2018, -0.7   Allergies  Allergen Reactions  . Codeine     Stomach ache, nausea    Current Outpatient Medications  Medication Sig Dispense Refill  . acetaminophen (TYLENOL) 650 MG CR tablet Take 650 mg by mouth every 8 (eight) hours as needed for pain.    . Albuterol Sulfate 108 (90 Base) MCG/ACT AEPB Inhale 2 puffs into the lungs every 6 (six) hours as needed (shortness of breath). 1 each 1  . Artificial Tear Solution (SOOTHE XP OP) Apply to eye. One drop to each eye every morning    . cetirizine (ZYRTEC) 5  MG tablet Take 5 mg by mouth daily.    . Cholecalciferol (VITAMIN D3) 25 MCG (1000 UT) CAPS Take by mouth.    . Fluticasone-Salmeterol (ADVAIR) 250-50 MCG/DOSE AEPB Inhale 1 puff into the lungs 2 (two) times daily. 3 each 3  . hydrochlorothiazide (HYDRODIURIL) 12.5 MG tablet Take 25 mg by mouth daily.   3  . losartan (COZAAR) 25 MG tablet Take 25 mg by mouth daily.    . Multiple Vitamin (MULTIVITAMIN) tablet Take 1 tablet by mouth daily.    Marland Kitchen omeprazole (PRILOSEC) 20 MG capsule Take 1 capsule (20 mg total) by mouth daily. 90 capsule 3  . Sennosides-Docusate Sodium (SENNA S PO) Take 1 tablet by mouth as needed (constipation).      No current facility-administered medications for this  visit.    OBJECTIVE: Middle-aged African-American woman who appears well  Vitals:   05/19/19 0927  BP: (!) 147/69  Pulse: 88  Resp: 17  Temp: 98.3 F (36.8 C)  SpO2: 100%     Body mass index is 29.96 kg/m.   Wt Readings from Last 3 Encounters:  05/19/19 163 lb 12.8 oz (74.3 kg)  03/11/19 160 lb 15 oz (73 kg)  01/21/19 160 lb (72.6 kg)      ECOG FS:1 - Symptomatic but completely ambulatory  Sclerae unicteric, EOMs intact Wearing a mask No cervical or supraclavicular adenopathy Lungs no rales or rhonchi Heart regular rate and rhythm Abd soft, nontender, positive bowel sounds MSK no focal spinal tenderness, no upper extremity lymphedema Neuro: nonfocal, well oriented, appropriate affect Breasts: The right breast is status post mastectomy.  The incision has healed very nicely.  There is no evidence of residual or recurrent disease.  The left breast is benign.  Both axillae are benign.   LAB RESULTS:  CMP     Component Value Date/Time   NA 142 03/07/2019 1207   K 4.7 03/07/2019 1207   CL 102 03/07/2019 1207   CO2 28 03/07/2019 1207   GLUCOSE 109 (H) 03/07/2019 1207   BUN 12 03/07/2019 1207   CREATININE 0.71 03/07/2019 1207   CREATININE 0.98 01/21/2019 1540   CALCIUM 9.6 03/07/2019 1207   PROT 7.4 01/21/2019 1540   ALBUMIN 4.1 01/21/2019 1540   AST 15 01/21/2019 1540   ALT 18 01/21/2019 1540   ALKPHOS 72 01/21/2019 1540   BILITOT 0.2 (L) 01/21/2019 1540   GFRNONAA >60 03/07/2019 1207   GFRNONAA 58 (L) 01/21/2019 1540   GFRAA >60 03/07/2019 1207   GFRAA >60 01/21/2019 1540    No results found for: TOTALPROTELP, ALBUMINELP, A1GS, A2GS, BETS, BETA2SER, GAMS, MSPIKE, SPEI  No results found for: KPAFRELGTCHN, LAMBDASER, KAPLAMBRATIO  Lab Results  Component Value Date   WBC 10.3 01/21/2019   NEUTROABS 5.6 01/21/2019   HGB 11.8 (L) 01/21/2019   HCT 35.4 (L) 01/21/2019   MCV 86.6 01/21/2019   PLT 367 01/21/2019   No results found for: LABCA2  No components  found for: [id number]  No results for input(s): INR in the last 168 hours.  No results found for: LABCA2  No results found for: [id number]  No results found for: [id number]  No results found for: [id number]  No results found for: CA2729  No components found for: HGQUANT  No results found for: CEA1    No results found for: AFPTUMOR  No results found for: [place]  No results found for: HGBA, HGBA2QUANT, HGBFQUANT, HGBSQUAN (Hemoglobinopathy evaluation)   No results found for: LDH  No results found  for: IRON, TIBC, IRONPCTSAT (Iron and TIBC)  No results found for: FERRITIN  Urinalysis No results found for: COLORURINE, APPEARANCEUR, LABSPEC, PHURINE, GLUCOSEU, HGBUR, BILIRUBINUR, KETONESUR, PROTEINUR, UROBILINOGEN, NITRITE, LEUKOCYTESUR   STUDIES: No results found.   ELIGIBLE FOR AVAILABLE RESEARCH PROTOCOL: no  ASSESSMENT: 69 y.o. [place], [place] woman status post right breast biopsy x2 on 01/08/2019, both biopsies showing ductal carcinoma in situ, grade 2 or 3, strongly estrogen progesterone receptor positive, weakly to negative progesterone receptor  (1) s/p right mastectomy 03/11/2019 for ductal carcinoma in situ, grade 3, measuring 2.4 cm, with ample margins  (a) all four sentinel nodes removed clear  (b) patient is not interested in reconstruction of  (2) adjuvant radiation not indicated  (3) opted against prophylactic antiestrogens   PLAN: Eileen Mccoy did well with her surgery.  Today I emphasized that this tumor was noninvasive, which means that it was entirely confined to the breast.  When the entire breast is removed, then the cancer is cured.  The cure rate for noninvasive breast cancer like hers with mastectomy approaches 100%.  Accordingly she needs no further treatment for this cancer, specifically no antiestrogens and no adjuvant radiation.  She could consider antiestrogens prophylactically.  The risk of her developing cancer in the contralateral breast (this would  be a completely different cancer not related to the earlier 1) is approximately 1/2 %/year.  That is 1 out of every 200 women like her every year.  If she took anastrozole for example for 5years that would be cut down to one fourth of a percent or 1 out of 400 women like her per year.  We talked about the cost of anastrozole which is minimal and the side effects complications and toxicities.  After discussion she is very clear that she does not wish to proceed with prophylactic antiestrogens.  She is very comfortable being released from follow-up here.  She has good follow-up from her primary care physician whom she sees twice a year.  As far as breast cancer follow-up is concerned all she needs is her left screening mammography yearly and a yearly physician breast and chest wall exam  Accordingly we are not making any return appointments for [facility] here but I will be glad to see her at any point in the future if on when the need arises.  Total encounter time 25 minutes.Chauncey Cruel, MD   05/19/2019 9:46 AM Medical Oncology and Hematology [facility] [place], [place] [address] Tel. [phone]    Fax. [phone]   This document serves as a record of services personally performed by Lurline Del, MD. It was created on his behalf by Wilburn Mylar, a trained medical scribe. The creation of this record is based on the scribe's personal observations and the provider's statements to them.   I, Lurline Del MD, have reviewed the above documentation for accuracy and completeness, and I agree with the above.   *Total Encounter Time as defined by the Centers for Medicare and Medicaid Services includes, in addition to the face-to-face time of a patient visit (documented in the note above) non-face-to-face time: obtaining and reviewing outside history, ordering and reviewing medications, tests or procedures, care coordination (communications with  other health care professionals or caregivers) and documentation in the medical record.

## 2019-05-19 ENCOUNTER — Other Ambulatory Visit: Payer: Self-pay

## 2019-05-19 ENCOUNTER — Inpatient Hospital Stay: Payer: Medicare Other | Attending: Oncology | Admitting: Oncology

## 2019-05-19 VITALS — BP 147/69 | HR 88 | Temp 98.3°F | Resp 17 | Ht 62.0 in | Wt 163.8 lb

## 2019-05-19 DIAGNOSIS — I10 Essential (primary) hypertension: Secondary | ICD-10-CM | POA: Diagnosis not present

## 2019-05-19 DIAGNOSIS — K219 Gastro-esophageal reflux disease without esophagitis: Secondary | ICD-10-CM | POA: Diagnosis not present

## 2019-05-19 DIAGNOSIS — D0511 Intraductal carcinoma in situ of right breast: Secondary | ICD-10-CM

## 2019-05-19 DIAGNOSIS — D0591 Unspecified type of carcinoma in situ of right breast: Secondary | ICD-10-CM | POA: Diagnosis not present

## 2019-05-19 DIAGNOSIS — Z170 Estrogen receptor positive status [ER+]: Secondary | ICD-10-CM | POA: Insufficient documentation

## 2019-05-20 ENCOUNTER — Telehealth: Payer: Self-pay | Admitting: Oncology

## 2019-05-20 NOTE — Telephone Encounter (Signed)
No 3/29 los. No changes made to pt's schedule.

## 2019-07-03 DIAGNOSIS — D0591 Unspecified type of carcinoma in situ of right breast: Secondary | ICD-10-CM | POA: Diagnosis not present

## 2019-07-03 DIAGNOSIS — L729 Follicular cyst of the skin and subcutaneous tissue, unspecified: Secondary | ICD-10-CM | POA: Diagnosis not present

## 2019-07-11 ENCOUNTER — Other Ambulatory Visit: Payer: Self-pay | Admitting: Surgery

## 2019-07-11 DIAGNOSIS — Z1231 Encounter for screening mammogram for malignant neoplasm of breast: Secondary | ICD-10-CM

## 2019-10-09 DIAGNOSIS — J4540 Moderate persistent asthma, uncomplicated: Secondary | ICD-10-CM | POA: Diagnosis not present

## 2019-10-09 DIAGNOSIS — J0100 Acute maxillary sinusitis, unspecified: Secondary | ICD-10-CM | POA: Diagnosis not present

## 2019-10-09 DIAGNOSIS — Z853 Personal history of malignant neoplasm of breast: Secondary | ICD-10-CM | POA: Diagnosis not present

## 2019-10-09 DIAGNOSIS — E882 Lipomatosis, not elsewhere classified: Secondary | ICD-10-CM | POA: Diagnosis not present

## 2019-10-09 DIAGNOSIS — M25561 Pain in right knee: Secondary | ICD-10-CM | POA: Diagnosis not present

## 2019-10-09 DIAGNOSIS — R7303 Prediabetes: Secondary | ICD-10-CM | POA: Diagnosis not present

## 2019-10-14 DIAGNOSIS — H5203 Hypermetropia, bilateral: Secondary | ICD-10-CM | POA: Diagnosis not present

## 2019-10-14 DIAGNOSIS — H40013 Open angle with borderline findings, low risk, bilateral: Secondary | ICD-10-CM | POA: Diagnosis not present

## 2019-10-14 DIAGNOSIS — H25819 Combined forms of age-related cataract, unspecified eye: Secondary | ICD-10-CM | POA: Diagnosis not present

## 2019-10-14 DIAGNOSIS — R7303 Prediabetes: Secondary | ICD-10-CM | POA: Diagnosis not present

## 2019-10-14 DIAGNOSIS — I10 Essential (primary) hypertension: Secondary | ICD-10-CM | POA: Diagnosis not present

## 2019-10-14 DIAGNOSIS — K219 Gastro-esophageal reflux disease without esophagitis: Secondary | ICD-10-CM | POA: Diagnosis not present

## 2019-10-14 DIAGNOSIS — E782 Mixed hyperlipidemia: Secondary | ICD-10-CM | POA: Diagnosis not present

## 2019-11-13 DIAGNOSIS — R05 Cough: Secondary | ICD-10-CM | POA: Diagnosis not present

## 2019-11-13 DIAGNOSIS — J019 Acute sinusitis, unspecified: Secondary | ICD-10-CM | POA: Diagnosis not present

## 2019-12-08 DIAGNOSIS — Z23 Encounter for immunization: Secondary | ICD-10-CM | POA: Diagnosis not present

## 2020-01-01 ENCOUNTER — Encounter (INDEPENDENT_AMBULATORY_CARE_PROVIDER_SITE_OTHER): Payer: Self-pay | Admitting: *Deleted

## 2020-01-14 ENCOUNTER — Other Ambulatory Visit: Payer: Self-pay

## 2020-01-14 ENCOUNTER — Ambulatory Visit
Admission: RE | Admit: 2020-01-14 | Discharge: 2020-01-14 | Disposition: A | Discharge status: Home / Self Care | Payer: Medicare Other | Source: Ambulatory Visit | Attending: Surgery | Admitting: Surgery

## 2020-01-14 DIAGNOSIS — Z1231 Encounter for screening mammogram for malignant neoplasm of breast: Secondary | ICD-10-CM

## 2020-01-14 DIAGNOSIS — D0591 Unspecified type of carcinoma in situ of right breast: Secondary | ICD-10-CM | POA: Diagnosis not present

## 2020-02-20 DIAGNOSIS — I10 Essential (primary) hypertension: Secondary | ICD-10-CM | POA: Diagnosis not present

## 2020-02-20 DIAGNOSIS — E882 Lipomatosis, not elsewhere classified: Secondary | ICD-10-CM | POA: Diagnosis not present

## 2020-02-20 DIAGNOSIS — Z853 Personal history of malignant neoplasm of breast: Secondary | ICD-10-CM | POA: Diagnosis not present

## 2020-02-20 DIAGNOSIS — K219 Gastro-esophageal reflux disease without esophagitis: Secondary | ICD-10-CM | POA: Diagnosis not present

## 2020-02-27 ENCOUNTER — Other Ambulatory Visit: Payer: Self-pay

## 2020-02-27 ENCOUNTER — Ambulatory Visit
Admission: RE | Admit: 2020-02-27 | Discharge: 2020-02-27 | Disposition: A | Discharge status: Home / Self Care | Payer: Medicare Other | Source: Ambulatory Visit | Attending: Surgery | Admitting: Surgery

## 2020-02-27 DIAGNOSIS — Z1231 Encounter for screening mammogram for malignant neoplasm of breast: Secondary | ICD-10-CM | POA: Diagnosis not present

## 2020-04-08 ENCOUNTER — Encounter (INDEPENDENT_AMBULATORY_CARE_PROVIDER_SITE_OTHER): Payer: Self-pay | Admitting: Gastroenterology

## 2020-04-08 ENCOUNTER — Telehealth (INDEPENDENT_AMBULATORY_CARE_PROVIDER_SITE_OTHER): Payer: Self-pay

## 2020-04-08 ENCOUNTER — Ambulatory Visit (INDEPENDENT_AMBULATORY_CARE_PROVIDER_SITE_OTHER): Payer: Medicare Other | Admitting: Gastroenterology

## 2020-04-08 ENCOUNTER — Encounter (INDEPENDENT_AMBULATORY_CARE_PROVIDER_SITE_OTHER): Payer: Self-pay

## 2020-04-08 ENCOUNTER — Other Ambulatory Visit (INDEPENDENT_AMBULATORY_CARE_PROVIDER_SITE_OTHER): Payer: Self-pay

## 2020-04-08 ENCOUNTER — Other Ambulatory Visit: Payer: Self-pay

## 2020-04-08 DIAGNOSIS — E882 Lipomatosis, not elsewhere classified: Secondary | ICD-10-CM | POA: Diagnosis not present

## 2020-04-08 DIAGNOSIS — M25561 Pain in right knee: Secondary | ICD-10-CM | POA: Diagnosis not present

## 2020-04-08 DIAGNOSIS — R7303 Prediabetes: Secondary | ICD-10-CM | POA: Diagnosis not present

## 2020-04-08 DIAGNOSIS — J0100 Acute maxillary sinusitis, unspecified: Secondary | ICD-10-CM | POA: Diagnosis not present

## 2020-04-08 DIAGNOSIS — K581 Irritable bowel syndrome with constipation: Secondary | ICD-10-CM | POA: Diagnosis not present

## 2020-04-08 DIAGNOSIS — J4540 Moderate persistent asthma, uncomplicated: Secondary | ICD-10-CM | POA: Diagnosis not present

## 2020-04-08 DIAGNOSIS — K5900 Constipation, unspecified: Secondary | ICD-10-CM | POA: Diagnosis not present

## 2020-04-08 DIAGNOSIS — Z1211 Encounter for screening for malignant neoplasm of colon: Secondary | ICD-10-CM

## 2020-04-08 DIAGNOSIS — K589 Irritable bowel syndrome without diarrhea: Secondary | ICD-10-CM | POA: Insufficient documentation

## 2020-04-08 DIAGNOSIS — Z853 Personal history of malignant neoplasm of breast: Secondary | ICD-10-CM | POA: Diagnosis not present

## 2020-04-08 MED ORDER — NA SULFATE-K SULFATE-MG SULF 17.5-3.13-1.6 GM/177ML PO SOLN: 354.0000 mL | Freq: Once | ORAL | 0 refills | Status: AC

## 2020-04-08 MED ORDER — LINACLOTIDE 72 MCG PO CAPS: 72.0000 ug | ORAL_CAPSULE | Freq: Every day | ORAL | 3 refills | Status: DC

## 2020-04-08 NOTE — Progress Notes (Signed)
Maylon Peppers, M.D. Gastroenterology & Hepatology [facility] [address] [place], [place] [address] Primary Care Physician: Celene Squibb, MD [address] [place] [address]  Referring MD: PCP  Chief Complaint:  constipation  History of Present Illness: Eileen Mccoy is a 70 y.o. female with past medical history of asthma, GERD, history of H. pylori, breast cancer s/p resection, hypertension who presents for evaluation of constipation.  Patient reports that for at least the last 4 years she has had constipation. The patient reports that if she does not take laxatives she can last up to 7 days without having a bowel movement.  She reports that he has to strain significantly to have a bowel movement.  Due to this, she takes 3 to 5 tablets of Sennakot every 2-3 days, which helps her having a bowel movement. She has not tried taking it on a daily basis but states that after having a bowel movement her abdominal distention resolves.  She reports that she has tried taking MiraLAX in the past but this has led to worsening bloating. The patient denies having any nausea, vomiting, fever, chills, hematochezia, melena, hematemesis, abdominal pain, diarrhea, jaundice, pruritus or weight loss.  The patient takes omeprazole 20 mg daily in the morning for control of her GERD symptoms such as heartburn, occasionally takes it fasting or sometimes with applesauce. This medication helps control her symptoms as she has not presented any more heartburn episodes.  Patient brings blood testing performed on 10/09/2019 which showed although cell count of 7.9, hemoglobin 11.8, platelets 370, CMP with normal liver enzymes AST 16, ALT 15, alkaline phosphatase 76, total bilirubin 0.4, normal electrolytes, renal function with creatinine 0.7 and BUN 11 normal.  Hemoglobin A1c was 6.4%.  The patient had a stool test for H. Pylori on 05/12/2016 - this was performed  off Prilosec.  This was after patient took Pylera.  Last EGD: 2018 - Normal esophagus. - Z-line irregular, 39 cm from the incisors. - Gastritis. Biopsied. Positive for HP. - Two gastric polyps. Biopsied - foveolar hyperplasia - Normal duodenal bulb and second portion of the duodenum.  Last Colonoscopy: 2011 - normal  FHx: neg for any gastrointestinal/liver disease, throat cancer Social: neg smoking, alcohol or illicit drug use Surgical: hysterectomy  Past Medical History: Past Medical History:  Diagnosis Date  . Asthma 02/23/2016  . Ductal carcinoma in situ (DCIS) of right breast 01/21/2019  . GERD (gastroesophageal reflux disease)   . H. pylori infection   . Hypertension     Past Surgical History: Past Surgical History:  Procedure Laterality Date  . ABDOMINAL HYSTERECTOMY    . BIOPSY  03/16/2016   Procedure: BIOPSY;  Surgeon: Rogene Houston, MD;  [facility];  Service: Endoscopy;;  gastric biopsies  . ESOPHAGOGASTRODUODENOSCOPY N/A 03/16/2016   Procedure: ESOPHAGOGASTRODUODENOSCOPY (EGD);  Surgeon: Rogene Houston, MD;  [facility];  Service: Endoscopy;  Laterality: N/A;  3:00  . MASTECTOMY W/ SENTINEL NODE BIOPSY Right 03/11/2019   Procedure: RIGHT MASTECTOMY WITH RIGHT AXILLARY SENTINEL LYMPH NODE BIOPSY;  Surgeon: Alphonsa Overall, MD;  Location: [facility];  Service: General;  Laterality: Right;  . PARTIAL HYSTERECTOMY     1998 for fibroids  . POLYPECTOMY  03/16/2016   Procedure: POLYPECTOMY;  Surgeon: Rogene Houston, MD;  [facility];  Service: Endoscopy;;  gastric polypectomies  . THYROIDECTOMY, PARTIAL      Family History: Family History  Problem Relation Age of Onset  . Heart disease Mother   . Asthma Mother   . COPD Mother 46       COPD  . Cancer Father   . Alcohol abuse Father   . COPD Sister 31  . Alcohol abuse Brother   . Heart disease Sister 96    Social History: Social History   Tobacco Use  Smoking  Status Never Smoker  Smokeless Tobacco Never Used   Social History   Substance and Sexual Activity  Alcohol Use Yes   Comment: occasionally   Social History   Substance and Sexual Activity  Drug Use No    Allergies: Allergies  Allergen Reactions  . Codeine     Stomach ache, nausea    Medications: Current Outpatient Medications  Medication Sig Dispense Refill  . acetaminophen (TYLENOL) 650 MG CR tablet Take 650 mg by mouth every 8 (eight) hours as needed for pain.    . Albuterol Sulfate 108 (90 Base) MCG/ACT AEPB Inhale 2 puffs into the lungs every 6 (six) hours as needed (shortness of breath). 1 each 1  . Artificial Tear Solution (SOOTHE XP OP) Apply to eye. One drop to each eye every morning    . cetirizine (ZYRTEC) 5 MG tablet Take 5 mg by mouth daily.    . Cholecalciferol (VITAMIN D3) 25 MCG (1000 UT) CAPS Take 1 capsule by mouth daily.    . Fluticasone-Salmeterol (ADVAIR) 250-50 MCG/DOSE AEPB Inhale 1 puff into the lungs 2 (two) times daily. 3 each 3  . hydrochlorothiazide (HYDRODIURIL) 12.5 MG tablet Take 25 mg by mouth daily.   3  . linaclotide (LINZESS) 72 MCG capsule Take 1 capsule (72 mcg total) by mouth daily before breakfast. 90 capsule 3  . losartan (COZAAR) 25 MG tablet Take 25 mg by mouth daily.    . Multiple Vitamin (MULTIVITAMIN) tablet Take 1 tablet by mouth daily.    Marland Kitchen omeprazole (PRILOSEC) 20 MG capsule Take 1 capsule (20 mg total) by mouth daily. 90 capsule 3  . Sennosides-Docusate Sodium (SENNA S PO) Take 1 tablet by mouth as needed (constipation).     . Na Sulfate-K Sulfate-Mg Sulf 17.5-3.13-1.6 GM/177ML SOLN Take 354 mLs by mouth once for 1 dose. 354 mL 0   No current facility-administered medications for this visit.    Review of Systems: GENERAL: negative for malaise, night sweats HEENT: No changes in hearing or vision, no nose bleeds or other nasal problems. NECK: Negative for lumps, goiter, pain and significant neck swelling RESPIRATORY:  Negative for cough, wheezing CARDIOVASCULAR: Negative for chest pain, leg swelling, palpitations, orthopnea GI: SEE HPI MUSCULOSKELETAL: Negative for joint pain or swelling, back pain, and muscle pain. SKIN: Negative for lesions, rash PSYCH: Negative for sleep disturbance, mood disorder and recent psychosocial stressors. HEMATOLOGY Negative for prolonged bleeding, bruising easily, and swollen nodes. ENDOCRINE: Negative for cold or heat intolerance, polyuria, polydipsia and goiter. NEURO: negative for tremor, gait imbalance, syncope and seizures. The remainder of the review of systems is noncontributory.   Physical Exam: BP (!) 155/77 (BP Location: Left Arm, Patient Position: Sitting, Cuff Size: Large)   Pulse (!) 103   Temp 98.4 F (36.9 C) (Oral)   Ht 5\' 2"  (1.575 m)   Wt 162 lb (73.5 kg)   LMP 02/21/1976 (Approximate)   BMI 29.63 kg/m  GENERAL: The patient is AO x3, in no acute distress. HEENT: Head is normocephalic and atraumatic. EOMI are intact. Mouth is well hydrated and without lesions. NECK: Supple.  No masses LUNGS: Clear to auscultation. No presence of rhonchi/wheezing/rales. Adequate chest expansion HEART: RRR, normal s1 and s2. ABDOMEN: Soft, nontender, no guarding, no peritoneal signs, and nondistended. BS +. No masses. EXTREMITIES: Without any cyanosis, clubbing, rash, lesions or edema. NEUROLOGIC: AOx3, no focal motor deficit. SKIN: no jaundice, no rashes   Imaging/Labs: as above  I personally reviewed and interpreted the available labs, imaging and endoscopic files.  Impression and Plan: Eileen Mccoy is a 70 y.o. female with past medical history of asthma, GERD, history of H. pylori, breast cancer s/p resection, hypertension who presents for evaluation of constipation.  The patient has presented persistent constipation for multiple years along with bloating.  She has not tolerated the use of MiraLAX in the past and has had some partial improvement with  Senokot.  I consider her symptoms are related to IBS-C but will check a TSH level today.  I advised the patient to increase the intake of prunes or kiwi to improve her bowel movement frequency, but we will also prescribe her Linzess 72 mcg/day.  Finally, the patient is due for colorectal cancer screening, I will order a colonoscopy for her today. Patient understood and agreed.  -Schedule colonoscopy -Check TSH -Start Linzess 72 mcg qday -Stop Sennakot -Eat prune and/or eat kiwi daily - RTC 6 months  All questions were answered.      Maylon Peppers, MD Gastroenterology and Hepatology [facility]

## 2020-04-08 NOTE — H&P (View-Only) (Signed)
Maylon Peppers, M.D. Gastroenterology & Hepatology [facility] [address] [place], [place] [address] Primary Care Physician: Celene Squibb, MD [address] [place] [address]  Referring MD: PCP  Chief Complaint:  constipation  History of Present Illness: Eileen Mccoy is a 70 y.o. female with past medical history of asthma, GERD, history of H. pylori, breast cancer s/p resection, hypertension who presents for evaluation of constipation.  Patient reports that for at least the last 4 years she has had constipation. The patient reports that if she does not take laxatives she can last up to 7 days without having a bowel movement.  She reports that he has to strain significantly to have a bowel movement.  Due to this, she takes 3 to 5 tablets of Sennakot every 2-3 days, which helps her having a bowel movement. She has not tried taking it on a daily basis but states that after having a bowel movement her abdominal distention resolves.  She reports that she has tried taking MiraLAX in the past but this has led to worsening bloating. The patient denies having any nausea, vomiting, fever, chills, hematochezia, melena, hematemesis, abdominal pain, diarrhea, jaundice, pruritus or weight loss.  The patient takes omeprazole 20 mg daily in the morning for control of her GERD symptoms such as heartburn, occasionally takes it fasting or sometimes with applesauce. This medication helps control her symptoms as she has not presented any more heartburn episodes.  Patient brings blood testing performed on 10/09/2019 which showed although cell count of 7.9, hemoglobin 11.8, platelets 370, CMP with normal liver enzymes AST 16, ALT 15, alkaline phosphatase 76, total bilirubin 0.4, normal electrolytes, renal function with creatinine 0.7 and BUN 11 normal.  Hemoglobin A1c was 6.4%.  The patient had a stool test for H. Pylori on 05/12/2016 - this was performed  off Prilosec.  This was after patient took Pylera.  Last EGD: 2018 - Normal esophagus. - Z-line irregular, 39 cm from the incisors. - Gastritis. Biopsied. Positive for HP. - Two gastric polyps. Biopsied - foveolar hyperplasia - Normal duodenal bulb and second portion of the duodenum.  Last Colonoscopy: 2011 - normal  FHx: neg for any gastrointestinal/liver disease, throat cancer Social: neg smoking, alcohol or illicit drug use Surgical: hysterectomy  Past Medical History: Past Medical History:  Diagnosis Date  . Asthma 02/23/2016  . Ductal carcinoma in situ (DCIS) of right breast 01/21/2019  . GERD (gastroesophageal reflux disease)   . H. pylori infection   . Hypertension     Past Surgical History: Past Surgical History:  Procedure Laterality Date  . ABDOMINAL HYSTERECTOMY    . BIOPSY  03/16/2016   Procedure: BIOPSY;  Surgeon: Rogene Houston, MD;  [facility];  Service: Endoscopy;;  gastric biopsies  . ESOPHAGOGASTRODUODENOSCOPY N/A 03/16/2016   Procedure: ESOPHAGOGASTRODUODENOSCOPY (EGD);  Surgeon: Rogene Houston, MD;  [facility]: [facility];  Service: Endoscopy;  Laterality: N/A;  3:00  . MASTECTOMY W/ SENTINEL NODE BIOPSY Right 03/11/2019   Procedure: RIGHT MASTECTOMY WITH RIGHT AXILLARY SENTINEL LYMPH NODE BIOPSY;  Surgeon: Alphonsa Overall, MD;  Location: [facility];  Service: General;  Laterality: Right;  . PARTIAL HYSTERECTOMY     1998 for fibroids  . POLYPECTOMY  03/16/2016   Procedure: POLYPECTOMY;  Surgeon: Rogene Houston, MD;  [facility]: [facility];  Service: Endoscopy;;  gastric polypectomies  . THYROIDECTOMY, PARTIAL      Family History: Family History  Problem Relation Age of Onset  . Heart disease Mother   . Asthma Mother   . COPD Mother 41       COPD  . Cancer Father   . Alcohol abuse Father   . COPD Sister 73  . Alcohol abuse Brother   . Heart disease Sister 39    Social History: Social History   Tobacco Use  Smoking  Status Never Smoker  Smokeless Tobacco Never Used   Social History   Substance and Sexual Activity  Alcohol Use Yes   Comment: occasionally   Social History   Substance and Sexual Activity  Drug Use No    Allergies: Allergies  Allergen Reactions  . Codeine     Stomach ache, nausea    Medications: Current Outpatient Medications  Medication Sig Dispense Refill  . acetaminophen (TYLENOL) 650 MG CR tablet Take 650 mg by mouth every 8 (eight) hours as needed for pain.    . Albuterol Sulfate 108 (90 Base) MCG/ACT AEPB Inhale 2 puffs into the lungs every 6 (six) hours as needed (shortness of breath). 1 each 1  . Artificial Tear Solution (SOOTHE XP OP) Apply to eye. One drop to each eye every morning    . cetirizine (ZYRTEC) 5 MG tablet Take 5 mg by mouth daily.    . Cholecalciferol (VITAMIN D3) 25 MCG (1000 UT) CAPS Take 1 capsule by mouth daily.    . Fluticasone-Salmeterol (ADVAIR) 250-50 MCG/DOSE AEPB Inhale 1 puff into the lungs 2 (two) times daily. 3 each 3  . hydrochlorothiazide (HYDRODIURIL) 12.5 MG tablet Take 25 mg by mouth daily.   3  . linaclotide (LINZESS) 72 MCG capsule Take 1 capsule (72 mcg total) by mouth daily before breakfast. 90 capsule 3  . losartan (COZAAR) 25 MG tablet Take 25 mg by mouth daily.    . Multiple Vitamin (MULTIVITAMIN) tablet Take 1 tablet by mouth daily.    Marland Kitchen omeprazole (PRILOSEC) 20 MG capsule Take 1 capsule (20 mg total) by mouth daily. 90 capsule 3  . Sennosides-Docusate Sodium (SENNA S PO) Take 1 tablet by mouth as needed (constipation).     . Na Sulfate-K Sulfate-Mg Sulf 17.5-3.13-1.6 GM/177ML SOLN Take 354 mLs by mouth once for 1 dose. 354 mL 0   No current facility-administered medications for this visit.    Review of Systems: GENERAL: negative for malaise, night sweats HEENT: No changes in hearing or vision, no nose bleeds or other nasal problems. NECK: Negative for lumps, goiter, pain and significant neck swelling RESPIRATORY:  Negative for cough, wheezing CARDIOVASCULAR: Negative for chest pain, leg swelling, palpitations, orthopnea GI: SEE HPI MUSCULOSKELETAL: Negative for joint pain or swelling, back pain, and muscle pain. SKIN: Negative for lesions, rash PSYCH: Negative for sleep disturbance, mood disorder and recent psychosocial stressors. HEMATOLOGY Negative for prolonged bleeding, bruising easily, and swollen nodes. ENDOCRINE: Negative for cold or heat intolerance, polyuria, polydipsia and goiter. NEURO: negative for tremor, gait imbalance, syncope and seizures. The remainder of the review of systems is noncontributory.   Physical Exam: BP (!) 155/77 (BP Location: Left Arm, Patient Position: Sitting, Cuff Size: Large)   Pulse (!) 103   Temp 98.4 F (36.9 C) (Oral)   Ht 5\' 2"  (1.575 m)   Wt 162 lb (73.5 kg)   LMP 02/21/1976 (Approximate)   BMI 29.63 kg/m  GENERAL: The patient is AO x3, in no acute distress. HEENT: Head is normocephalic and atraumatic. EOMI are intact. Mouth is well hydrated and without lesions. NECK: Supple.  No masses LUNGS: Clear to auscultation. No presence of rhonchi/wheezing/rales. Adequate chest expansion HEART: RRR, normal s1 and s2. ABDOMEN: Soft, nontender, no guarding, no peritoneal signs, and nondistended. BS +. No masses. EXTREMITIES: Without any cyanosis, clubbing, rash, lesions or edema. NEUROLOGIC: AOx3, no focal motor deficit. SKIN: no jaundice, no rashes   Imaging/Labs: as above  I personally reviewed and interpreted the available labs, imaging and endoscopic files.  Impression and Plan: Eileen Mccoy is a 70 y.o. female with past medical history of asthma, GERD, history of H. pylori, breast cancer s/p resection, hypertension who presents for evaluation of constipation.  The patient has presented persistent constipation for multiple years along with bloating.  She has not tolerated the use of MiraLAX in the past and has had some partial improvement with  Senokot.  I consider her symptoms are related to IBS-C but will check a TSH level today.  I advised the patient to increase the intake of prunes or kiwi to improve her bowel movement frequency, but we will also prescribe her Linzess 72 mcg/day.  Finally, the patient is due for colorectal cancer screening, I will order a colonoscopy for her today. Patient understood and agreed.  -Schedule colonoscopy -Check TSH -Start Linzess 72 mcg qday -Stop Sennakot -Eat prune and/or eat kiwi daily - RTC 6 months  All questions were answered.      Maylon Peppers, MD Gastroenterology and Hepatology [facility]

## 2020-04-08 NOTE — Patient Instructions (Signed)
Schedule colonoscopy Perform blood workup Start Linzess 72 mcg qday Stop Sennakot Eat prune and/or eat kiwi daily

## 2020-04-08 NOTE — Telephone Encounter (Signed)
LeighAnn Megham Dwyer, CMA  

## 2020-04-09 ENCOUNTER — Encounter (INDEPENDENT_AMBULATORY_CARE_PROVIDER_SITE_OTHER): Payer: Self-pay

## 2020-04-10 LAB — TSH: TSH: 0.92 mIU/L (ref 0.40–4.50)

## 2020-04-13 DIAGNOSIS — K219 Gastro-esophageal reflux disease without esophagitis: Secondary | ICD-10-CM | POA: Diagnosis not present

## 2020-04-13 DIAGNOSIS — Z853 Personal history of malignant neoplasm of breast: Secondary | ICD-10-CM | POA: Diagnosis not present

## 2020-04-13 DIAGNOSIS — J4530 Mild persistent asthma, uncomplicated: Secondary | ICD-10-CM | POA: Diagnosis not present

## 2020-04-13 DIAGNOSIS — I10 Essential (primary) hypertension: Secondary | ICD-10-CM | POA: Diagnosis not present

## 2020-04-13 DIAGNOSIS — E782 Mixed hyperlipidemia: Secondary | ICD-10-CM | POA: Diagnosis not present

## 2020-04-13 DIAGNOSIS — R7303 Prediabetes: Secondary | ICD-10-CM | POA: Diagnosis not present

## 2020-04-14 ENCOUNTER — Other Ambulatory Visit (INDEPENDENT_AMBULATORY_CARE_PROVIDER_SITE_OTHER): Payer: Self-pay

## 2020-04-14 DIAGNOSIS — Z1211 Encounter for screening for malignant neoplasm of colon: Secondary | ICD-10-CM

## 2020-04-19 ENCOUNTER — Other Ambulatory Visit: Payer: Self-pay

## 2020-04-19 ENCOUNTER — Other Ambulatory Visit (HOSPITAL_COMMUNITY)
Admission: RE | Admit: 2020-04-19 | Discharge: 2020-04-19 | Disposition: A | Discharge status: Home / Self Care | Payer: Medicare Other | Source: Ambulatory Visit | Attending: Gastroenterology | Admitting: Gastroenterology

## 2020-04-19 DIAGNOSIS — Z01812 Encounter for preprocedural laboratory examination: Secondary | ICD-10-CM | POA: Insufficient documentation

## 2020-04-19 DIAGNOSIS — H40013 Open angle with borderline findings, low risk, bilateral: Secondary | ICD-10-CM | POA: Diagnosis not present

## 2020-04-19 DIAGNOSIS — Z20822 Contact with and (suspected) exposure to covid-19: Secondary | ICD-10-CM | POA: Diagnosis not present

## 2020-04-20 LAB — SARS CORONAVIRUS 2 (TAT 6-24 HRS): SARS Coronavirus 2: NEGATIVE

## 2020-04-21 ENCOUNTER — Encounter (HOSPITAL_COMMUNITY)
Admission: RE | Disposition: A | Discharge status: Home / Self Care | Payer: Self-pay | Source: Ambulatory Visit | Attending: Gastroenterology

## 2020-04-21 ENCOUNTER — Other Ambulatory Visit: Payer: Self-pay

## 2020-04-21 ENCOUNTER — Ambulatory Visit (HOSPITAL_COMMUNITY): Payer: Medicare Other | Admitting: Anesthesiology

## 2020-04-21 ENCOUNTER — Ambulatory Visit (HOSPITAL_COMMUNITY)
Admission: RE | Admit: 2020-04-21 | Discharge: 2020-04-21 | Disposition: A | Discharge status: Home / Self Care | Payer: Medicare Other | Source: Ambulatory Visit | Attending: Gastroenterology | Admitting: Gastroenterology

## 2020-04-21 ENCOUNTER — Encounter (HOSPITAL_COMMUNITY): Payer: Self-pay | Admitting: Gastroenterology

## 2020-04-21 DIAGNOSIS — Z79899 Other long term (current) drug therapy: Secondary | ICD-10-CM | POA: Diagnosis not present

## 2020-04-21 DIAGNOSIS — Z1211 Encounter for screening for malignant neoplasm of colon: Secondary | ICD-10-CM | POA: Diagnosis not present

## 2020-04-21 DIAGNOSIS — Z7951 Long term (current) use of inhaled steroids: Secondary | ICD-10-CM | POA: Insufficient documentation

## 2020-04-21 DIAGNOSIS — K5900 Constipation, unspecified: Secondary | ICD-10-CM | POA: Insufficient documentation

## 2020-04-21 DIAGNOSIS — J45909 Unspecified asthma, uncomplicated: Secondary | ICD-10-CM | POA: Diagnosis not present

## 2020-04-21 DIAGNOSIS — Z8619 Personal history of other infectious and parasitic diseases: Secondary | ICD-10-CM | POA: Insufficient documentation

## 2020-04-21 DIAGNOSIS — K219 Gastro-esophageal reflux disease without esophagitis: Secondary | ICD-10-CM | POA: Diagnosis not present

## 2020-04-21 DIAGNOSIS — Z86000 Personal history of in-situ neoplasm of breast: Secondary | ICD-10-CM | POA: Insufficient documentation

## 2020-04-21 DIAGNOSIS — D123 Benign neoplasm of transverse colon: Secondary | ICD-10-CM | POA: Insufficient documentation

## 2020-04-21 DIAGNOSIS — K648 Other hemorrhoids: Secondary | ICD-10-CM | POA: Insufficient documentation

## 2020-04-21 DIAGNOSIS — K635 Polyp of colon: Secondary | ICD-10-CM | POA: Diagnosis not present

## 2020-04-21 DIAGNOSIS — I10 Essential (primary) hypertension: Secondary | ICD-10-CM | POA: Insufficient documentation

## 2020-04-21 HISTORY — PX: COLONOSCOPY WITH PROPOFOL: SHX5780

## 2020-04-21 HISTORY — PX: POLYPECTOMY: SHX149

## 2020-04-21 LAB — HM COLONOSCOPY

## 2020-04-21 SURGERY — COLONOSCOPY WITH PROPOFOL
Anesthesia: General

## 2020-04-21 MED ORDER — CHLORHEXIDINE GLUCONATE CLOTH 2 % EX PADS: 6.0000 | MEDICATED_PAD | Freq: Once | CUTANEOUS | Status: DC

## 2020-04-21 MED ORDER — LIDOCAINE HCL (CARDIAC) PF 100 MG/5ML IV SOSY
PREFILLED_SYRINGE | INTRAVENOUS | Status: DC | PRN
  Administered 2020-04-21: 50 mg via INTRAVENOUS

## 2020-04-21 MED ORDER — PROPOFOL 10 MG/ML IV BOLUS
INTRAVENOUS | Status: DC | PRN
  Administered 2020-04-21: 50 mg via INTRAVENOUS

## 2020-04-21 MED ORDER — PROPOFOL 10 MG/ML IV BOLUS
INTRAVENOUS | Status: AC
  Filled 2020-04-21: qty 80

## 2020-04-21 MED ORDER — LACTATED RINGERS IV SOLN: INTRAVENOUS | Status: DC

## 2020-04-21 MED ORDER — PROPOFOL 500 MG/50ML IV EMUL
INTRAVENOUS | Status: DC | PRN
  Administered 2020-04-21: 150 ug/kg/min via INTRAVENOUS

## 2020-04-21 MED ORDER — STERILE WATER FOR IRRIGATION IR SOLN
Status: DC | PRN
  Administered 2020-04-21: 200 mL

## 2020-04-21 NOTE — Anesthesia Postprocedure Evaluation (Signed)
Anesthesia Post Note  Patient: Eileen Mccoy  Procedure(s) Performed: COLONOSCOPY WITH PROPOFOL (N/A ) POLYPECTOMY INTESTINAL  Patient location during evaluation: Phase II Anesthesia Type: General Level of consciousness: awake, oriented, awake and alert and patient cooperative Pain management: satisfactory to patient Vital Signs Assessment: post-procedure vital signs reviewed and stable Respiratory status: spontaneous breathing, respiratory function stable and nonlabored ventilation Cardiovascular status: stable Postop Assessment: no apparent nausea or vomiting Anesthetic complications: no   No complications documented.   Last Vitals:  Vitals:   04/21/20 0925 04/21/20 1124  BP: (!) 144/73 (!) 143/62  Pulse: 70 84  Resp: 12 18  Temp: 36.9 C 36.6 C  SpO2: 100% 97%    Last Pain:  Vitals:   04/21/20 1124  TempSrc: Oral  PainSc: 0-No pain                 GREGORY,SUZANNE

## 2020-04-21 NOTE — Anesthesia Preprocedure Evaluation (Addendum)
Anesthesia Evaluation  Patient identified by MRN, date of birth, ID band Patient awake    Reviewed: Allergy & Precautions, NPO status , Patient's Chart, lab work & pertinent test results  History of Anesthesia Complications Negative for: history of anesthetic complications  Airway Mallampati: II  TM Distance: >3 FB Neck ROM: Full    Dental  (+) Dental Advisory Given   Pulmonary asthma ,    Pulmonary exam normal breath sounds clear to auscultation       Cardiovascular Exercise Tolerance: Good hypertension, Pt. on medications Normal cardiovascular exam Rhythm:Regular Rate:Normal     Neuro/Psych negative neurological ROS  negative psych ROS   GI/Hepatic Neg liver ROS, GERD  Medicated and Controlled,  Endo/Other  negative endocrine ROS  Renal/GU Renal diseasenegative Renal ROS  negative genitourinary   Musculoskeletal negative musculoskeletal ROS (+)   Abdominal   Peds negative pediatric ROS (+)  Hematology negative hematology ROS (+)   Anesthesia Other Findings Right breast cancer  Reproductive/Obstetrics negative OB ROS                            Anesthesia Physical Anesthesia Plan  ASA: II  Anesthesia Plan: General   Post-op Pain Management:    Induction: Intravenous  PONV Risk Score and Plan: TIVA  Airway Management Planned: Nasal Cannula and Natural Airway  Additional Equipment:   Intra-op Plan:   Post-operative Plan:   Informed Consent: I have reviewed the patients History and Physical, chart, labs and discussed the procedure including the risks, benefits and alternatives for the proposed anesthesia with the patient or authorized representative who has indicated his/her understanding and acceptance.     Dental advisory given  Plan Discussed with: CRNA and Surgeon  Anesthesia Plan Comments:         Anesthesia Quick Evaluation

## 2020-04-21 NOTE — Interval H&P Note (Signed)
History and Physical Interval Note:  04/21/2020 9:56 AM Eileen Mccoy is a 70 y.o. female with past medical history of asthma, GERD, history of H. pylori, breast cancer s/p resection, hypertension who presents for CRC screening.  Last Colonoscopy: 2011 - normal. The patient denies having any complaints such as melena, hematochezia, abdominal pain or distention, change in her bowel movement consistency or frequency, no changes in her weight recently.  No family history of colorectal cancer.  BP (!) 144/73   Pulse 70   Temp 98.4 F (36.9 C) (Oral)   Resp 12   Ht 5\' 2"  (1.575 m)   LMP 02/21/1976 (Approximate)   SpO2 100%   BMI 29.63 kg/m  GENERAL: The patient is AO x3, in no acute distress. HEENT: Head is normocephalic and atraumatic. EOMI are intact. Mouth is well hydrated and without lesions. NECK: Supple. No masses LUNGS: Clear to auscultation. No presence of rhonchi/wheezing/rales. Adequate chest expansion HEART: RRR, normal s1 and s2. ABDOMEN: Soft, nontender, no guarding, no peritoneal signs, and nondistended. BS +. No masses. EXTREMITIES: Without any cyanosis, clubbing, rash, lesions or edema. NEUROLOGIC: AOx3, no focal motor deficit. SKIN: no jaundice, no rashes  Eileen Mccoy  has presented today for surgery, with the diagnosis of Screening Colonoscopy.  The various methods of treatment have been discussed with the patient and family. After consideration of risks, benefits and other options for treatment, the patient has consented to  Procedure(s) with comments: COLONOSCOPY WITH PROPOFOL (N/A) - am as a surgical intervention.  The patient's history has been reviewed, patient examined, no change in status, stable for surgery.  I have reviewed the patient's chart and labs.  Questions were answered to the patient's satisfaction.     Maylon Peppers [place]orga

## 2020-04-21 NOTE — Transfer of Care (Signed)
Immediate Anesthesia Transfer of Care Note  Patient: Eileen Mccoy  Procedure(s) Performed: COLONOSCOPY WITH PROPOFOL (N/A ) POLYPECTOMY INTESTINAL  Patient Location: PACU  Anesthesia Type:General  Level of Consciousness: awake, alert , oriented and patient cooperative  Airway & Oxygen Therapy: Patient Spontanous Breathing  Post-op Assessment: Report given to RN, Post -op Vital signs reviewed and stable and Patient moving all extremities X 4  Post vital signs: Reviewed and stable  Last Vitals:  Vitals Value Taken Time  BP 143/62 04/21/20 1124  Temp 36.6 C 04/21/20 1124  Pulse 84 04/21/20 1124  Resp 18 04/21/20 1124  SpO2 97 % 04/21/20 1124    Last Pain:  Vitals:   04/21/20 1124  TempSrc: Oral  PainSc: 0-No pain         Complications: No complications documented.

## 2020-04-21 NOTE — Discharge Instructions (Signed)
You are being discharged to home.  Resume your previous diet.  Continue your present medications.  We are waiting for your pathology results.  Your physician has recommended a repeat colonoscopy for surveillance based on pathology results.    Colonoscopy, Adult, Care After This sheet gives you information about how to care for yourself after your procedure. Your doctor may also give you more specific instructions. If you have problems or questions, call your doctor. What can I expect after the procedure? After the procedure, it is common to have:  A small amount of blood in your poop (stool) for 24 hours.  Some gas.  Mild cramping or bloating in your belly (abdomen). Follow these instructions at home: Eating and drinking  Drink enough fluid to keep your pee (urine) pale yellow.  Follow instructions from your doctor about what you cannot eat or drink.  Return to your normal diet as told by your doctor. Avoid heavy or fried foods that are hard to digest.   Activity  Rest as told by your doctor.  Do not sit for a long time without moving. Get up to take short walks every 1-2 hours. This is important. Ask for help if you feel weak or unsteady.  Return to your normal activities as told by your doctor. Ask your doctor what activities are safe for you. To help cramping and bloating:  Try walking around.  Put heat on your belly as told by your doctor. Use the heat source that your doctor recommends, such as a moist heat pack or a heating pad. ? Put a towel between your skin and the heat source. ? Leave the heat on for 20-30 minutes. ? Remove the heat if your skin turns bright red. This is very important if you are unable to feel pain, heat, or cold. You may have a greater risk of getting burned.   General instructions  If you were given a medicine to help you relax (sedative) during your procedure, it can affect you for many hours. Do not drive or use machinery until your doctor  says that it is safe.  For the first 24 hours after the procedure: ? Do not sign important documents. ? Do not drink alcohol. ? Do your daily activities more slowly than normal. ? Eat foods that are soft and easy to digest.  Take over-the-counter or prescription medicines only as told by your doctor.  Keep all follow-up visits as told by your doctor. This is important. Contact a doctor if:  You have blood in your poop 2-3 days after the procedure. Get help right away if:  You have more than a small amount of blood in your poop.  You see large clumps of tissue (blood clots) in your poop.  Your belly is swollen.  You feel like you may vomit (nauseous).  You vomit.  You have a fever.  You have belly pain that gets worse, and medicine does not help your pain. Summary  After the procedure, it is common to have a small amount of blood in your poop. You may also have mild cramping and bloating in your belly.  If you were given a medicine to help you relax (sedative) during your procedure, it can affect you for many hours. Do not drive or use machinery until your doctor says that it is safe.  Get help right away if you have a lot of blood in your poop, feel like you may vomit, have a fever, or have more belly  pain. This information is not intended to replace advice given to you by your health care provider. Make sure you discuss any questions you have with your health care provider. Document Revised: 12/13/2018 Document Reviewed: 09/02/2018 Elsevier Patient Education  [place].  Colon Polyps  Colon polyps are tissue growths inside the colon, which is part of the large intestine. They are one of the types of polyps that can grow in the body. A polyp may be a round bump or a mushroom-shaped growth. You could have one polyp or more than one. Most colon polyps are noncancerous (benign). However, some colon polyps can become cancerous over time. Finding and removing the polyps  early can help prevent this. What are the causes? The exact cause of colon polyps is not known. What increases the risk? The following factors may make you more likely to develop this condition:  Having a family history of colorectal cancer or colon polyps.  Being older than 72 years of age.  Being younger than 72 years of age and having a significant family history of colorectal cancer or colon polyps or a genetic condition that puts you at higher risk of getting colon polyps.  Having inflammatory bowel disease, such as ulcerative colitis or Crohn's disease.  Having certain conditions passed from parent to child (hereditary conditions), such as: ? Familial adenomatous polyposis (FAP). ? Lynch syndrome. ? Turcot syndrome. ? Peutz-Jeghers syndrome. ? MUTYH-associated polyposis (MAP).  Being overweight.  Certain lifestyle factors. These include smoking cigarettes, drinking too much alcohol, not getting enough exercise, and eating a diet that is high in fat and red meat and low in fiber.  Having had childhood cancer that was treated with radiation of the abdomen. What are the signs or symptoms? Many times, there are no symptoms. If you have symptoms, they may include:  Blood coming from the rectum during a bowel movement.  Blood in the stool (feces). The blood may be bright red or very dark in color.  Pain in the abdomen.  A change in bowel habits, such as constipation or diarrhea. How is this diagnosed? This condition is diagnosed with a colonoscopy. This is a procedure in which a lighted, flexible scope is inserted into the opening between the buttocks (anus) and then passed into the colon to examine the area. Polyps are sometimes found when a colonoscopy is done as part of routine cancer screening tests. How is this treated? This condition is treated by removing any polyps that are found. Most polyps can be removed during a colonoscopy. Those polyps will then be tested for  cancer. Additional treatment may be needed depending on the results of testing. Follow these instructions at home: Eating and drinking  Eat foods that are high in fiber, such as fruits, vegetables, and whole grains.  Eat foods that are high in calcium and vitamin D, such as milk, cheese, yogurt, eggs, liver, fish, and broccoli.  Limit foods that are high in fat, such as fried foods and desserts.  Limit the amount of red meat, precooked or cured meat, or other processed meat that you eat, such as hot dogs, sausages, bacon, or meat loaves.  Limit sugary drinks.   Lifestyle  Maintain a healthy weight, or lose weight if recommended by your health care provider.  Exercise every day or as told by your health care provider.  Do not use any products that contain nicotine or tobacco, such as cigarettes, e-cigarettes, and chewing tobacco. If you need help quitting, ask your  health care provider.  Do not drink alcohol if: ? Your health care provider tells you not to drink. ? You are pregnant, may be pregnant, or are planning to become pregnant.  If you drink alcohol: ? Limit how much you use to:  0-1 drink a day for women.  0-2 drinks a day for men. ? Know how much alcohol is in your drink. In the [place] one drink equals one 12 oz bottle of beer (355 mL), one 5 oz glass of wine (148 mL), or one 1 oz glass of hard liquor (44 mL). General instructions  Take over-the-counter and prescription medicines only as told by your health care provider.  Keep all follow-up visits. This is important. This includes having regularly scheduled colonoscopies. Talk to your health care provider about when you need a colonoscopy. Contact a health care provider if:  You have new or worsening bleeding during a bowel movement.  You have new or increased blood in your stool.  You have a change in bowel habits.  You lose weight for no known reason. Summary  Colon polyps are tissue growths inside the  colon, which is part of the large intestine. They are one type of polyp that can grow in the body.  Most colon polyps are noncancerous (benign), but some can become cancerous over time.  This condition is diagnosed with a colonoscopy.  This condition is treated by removing any polyps that are found. Most polyps can be removed during a colonoscopy. This information is not intended to replace advice given to you by your health care provider. Make sure you discuss any questions you have with your health care provider. Document Revised: 05/28/2019 Document Reviewed: 05/28/2019 Elsevier Patient Education  2021 [place].  Hemorrhoids Hemorrhoids are swollen veins in and around the rectum or anus. There are two types of hemorrhoids:  Internal hemorrhoids. These occur in the veins that are just inside the rectum. They may poke through to the outside and become irritated and painful.  External hemorrhoids. These occur in the veins that are outside the anus and can be felt as a painful swelling or hard lump near the anus. Most hemorrhoids do not cause serious problems, and they can be managed with home treatments such as diet and lifestyle changes. If home treatments do not help the symptoms, procedures can be done to shrink or remove the hemorrhoids. What are the causes? This condition is caused by increased pressure in the anal area. This pressure may result from various things, including:  Constipation.  Straining to have a bowel movement.  Diarrhea.  Pregnancy.  Obesity.  Sitting for long periods of time.  Heavy lifting or other activity that causes you to strain.  Anal sex.  Riding a bike for a long period of time. What are the signs or symptoms? Symptoms of this condition include:  Pain.  Anal itching or irritation.  Rectal bleeding.  Leakage of stool (feces).  Anal swelling.  One or more lumps around the anus. How is this diagnosed? This condition can often be  diagnosed through a visual exam. Other exams or tests may also be done, such as:  An exam that involves feeling the rectal area with a gloved hand (digital rectal exam).  An exam of the anal canal that is done using a small tube (anoscope).  A blood test, if you have lost a significant amount of blood.  A test to look inside the colon using a flexible tube with a camera on  the end (sigmoidoscopy or colonoscopy). How is this treated? This condition can usually be treated at home. However, various procedures may be done if dietary changes, lifestyle changes, and other home treatments do not help your symptoms. These procedures can help make the hemorrhoids smaller or remove them completely. Some of these procedures involve surgery, and others do not. Common procedures include:  Rubber band ligation. Rubber bands are placed at the base of the hemorrhoids to cut off their blood supply.  Sclerotherapy. Medicine is injected into the hemorrhoids to shrink them.  Infrared coagulation. A type of light energy is used to get rid of the hemorrhoids.  Hemorrhoidectomy surgery. The hemorrhoids are surgically removed, and the veins that supply them are tied off.  Stapled hemorrhoidopexy surgery. The surgeon staples the base of the hemorrhoid to the rectal wall. Follow these instructions at home: Eating and drinking  Eat foods that have a lot of fiber in them, such as whole grains, beans, nuts, fruits, and vegetables.  Ask your health care provider about taking products that have added fiber (fiber supplements).  Reduce the amount of fat in your diet. You can do this by eating low-fat dairy products, eating less red meat, and avoiding processed foods.  Drink enough fluid to keep your urine pale yellow.   Managing pain and swelling  Take warm sitz baths for 20 minutes, 3-4 times a day to ease pain and discomfort. You may do this in a bathtub or using a portable sitz bath that fits over the  toilet.  If directed, apply ice to the affected area. Using ice packs between sitz baths may be helpful. ? Put ice in a plastic bag. ? Place a towel between your skin and the bag. ? Leave the ice on for 20 minutes, 2-3 times a day.   General instructions  Take over-the-counter and prescription medicines only as told by your health care provider.  Use medicated creams or suppositories as told.  Get regular exercise. Ask your health care provider how much and what kind of exercise is best for you. In general, you should do moderate exercise for at least 30 minutes on most days of the week (150 minutes each week). This can include activities such as walking, biking, or yoga.  Go to the bathroom when you have the urge to have a bowel movement. Do not wait.  Avoid straining to have bowel movements.  Keep the anal area dry and clean. Use wet toilet paper or moist towelettes after a bowel movement.  Do not sit on the toilet for long periods of time. This increases blood pooling and pain.  Keep all follow-up visits as told by your health care provider. This is important. Contact a health care provider if you have:  Increasing pain and swelling that are not controlled by treatment or medicine.  Difficulty having a bowel movement, or you are unable to have a bowel movement.  Pain or inflammation outside the area of the hemorrhoids. Get help right away if you have:  Uncontrolled bleeding from your rectum. Summary  Hemorrhoids are swollen veins in and around the rectum or anus.  Most hemorrhoids can be managed with home treatments such as diet and lifestyle changes.  Taking warm sitz baths can help ease pain and discomfort.  In severe cases, procedures or surgery can be done to shrink or remove the hemorrhoids. This information is not intended to replace advice given to you by your health care provider. Make sure you discuss any  questions you have with your health care  provider. Document Revised: 07/05/2018 Document Reviewed: 06/28/2017 Elsevier Patient Education  [place].

## 2020-04-21 NOTE — Op Note (Signed)
[facility] Patient Name: Eileen Mccoy Procedure Date: 04/21/2020 10:46 AM MRN: [id number] Date of Birth: 1950-03-24 Attending MD: Maylon Peppers ,  CSN: [id number] Age: 70 Admit Type: Outpatient Procedure:                Colonoscopy Indications:              Screening for colorectal malignant neoplasm Providers:                Maylon Peppers, [place] Page, Halma Risa Grill, Technician Referring MD:             Edwinna Areola. [place] Crane MD Medicines:                Monitored Anesthesia Care Complications:            No immediate complications. Estimated Blood Loss:     Estimated blood loss: none. Procedure:                Pre-Anesthesia Assessment:                           - Prior to the procedure, a History and Physical                            was performed, and patient medications, allergies                            and sensitivities were reviewed. The patient's                            tolerance of previous anesthesia was reviewed.                           - The risks and benefits of the procedure and the                            sedation options and risks were discussed with the                            patient. All questions were answered and informed                            consent was obtained.                           - ASA Grade Assessment: II - A patient with mild                            systemic disease.                           After obtaining informed consent, the colonoscope                            was passed under direct vision. Throughout the  procedure, the patient's blood pressure, pulse, and                            oxygen saturations were monitored continuously. The                            [id number]-HQ190L ([id number]) scope was introduced through                            the anus and advanced to the the cecum, identified                            by appendiceal orifice and ileocecal  valve. The                            colonoscopy was performed without difficulty. The                            patient tolerated the procedure well. The quality                            of the bowel preparation was adequate. Scope                            withdrawal time was 10 minutes. Scope In: 11:00:18 AM Scope Out: 11:20:21 AM Scope Withdrawal Time: 0 hours 15 minutes 54 seconds  Total Procedure Duration: 0 hours 20 minutes 3 seconds  Findings:      The perianal and digital rectal examinations were normal.      A 2 mm polyp was found in the transverse colon. The polyp was sessile.       The polyp was removed with a cold biopsy forceps. Resection and       retrieval were complete.      Non-bleeding internal hemorrhoids were found during retroflexion. The       hemorrhoids were small. Impression:               - One 2 mm polyp in the transverse colon, removed                            with a cold biopsy forceps. Resected and retrieved.                           - Non-bleeding internal hemorrhoids. Moderate Sedation:      Per Anesthesia Care Recommendation:           - Discharge patient to home (ambulatory).                           - Resume previous diet.                           - Continue present medications.                           - Await pathology results.                           -  Repeat colonoscopy for surveillance based on                            pathology results. Procedure Code(s):        --- Professional ---                           [phone], Colonoscopy, flexible; with biopsy, single                            or multiple Diagnosis Code(s):        --- Professional ---                           Z12.11, Encounter for screening for malignant                            neoplasm of colon                           K63.5, Polyp of colon                           K64.8, Other hemorrhoids CPT copyright 2019 [employer]. All rights  reserved. The codes documented in this report are preliminary and upon coder review may  be revised to meet current compliance requirements. Maylon Peppers, MD Maylon Peppers,  04/21/2020 11:58:39 AM This report has been signed electronically. Number of Addenda: 0

## 2020-04-22 LAB — SURGICAL PATHOLOGY

## 2020-04-23 ENCOUNTER — Encounter (INDEPENDENT_AMBULATORY_CARE_PROVIDER_SITE_OTHER): Payer: Self-pay | Admitting: *Deleted

## 2020-04-27 ENCOUNTER — Encounter (HOSPITAL_COMMUNITY): Payer: Self-pay | Admitting: Gastroenterology

## 2020-05-19 DIAGNOSIS — E1165 Type 2 diabetes mellitus with hyperglycemia: Secondary | ICD-10-CM | POA: Diagnosis not present

## 2020-05-19 DIAGNOSIS — I10 Essential (primary) hypertension: Secondary | ICD-10-CM | POA: Diagnosis not present

## 2020-05-25 DIAGNOSIS — Z9011 Acquired absence of right breast and nipple: Secondary | ICD-10-CM | POA: Diagnosis not present

## 2020-06-20 DIAGNOSIS — K219 Gastro-esophageal reflux disease without esophagitis: Secondary | ICD-10-CM | POA: Diagnosis not present

## 2020-06-20 DIAGNOSIS — E1165 Type 2 diabetes mellitus with hyperglycemia: Secondary | ICD-10-CM | POA: Diagnosis not present

## 2020-07-20 DIAGNOSIS — R5383 Other fatigue: Secondary | ICD-10-CM | POA: Diagnosis not present

## 2020-07-20 DIAGNOSIS — U071 COVID-19: Secondary | ICD-10-CM | POA: Diagnosis not present

## 2020-08-19 DIAGNOSIS — K219 Gastro-esophageal reflux disease without esophagitis: Secondary | ICD-10-CM | POA: Diagnosis not present

## 2020-08-19 DIAGNOSIS — E1165 Type 2 diabetes mellitus with hyperglycemia: Secondary | ICD-10-CM | POA: Diagnosis not present

## 2020-09-21 ENCOUNTER — Other Ambulatory Visit: Payer: Self-pay | Admitting: Surgery

## 2020-09-24 DIAGNOSIS — E782 Mixed hyperlipidemia: Secondary | ICD-10-CM | POA: Insufficient documentation

## 2020-09-24 DIAGNOSIS — I10 Essential (primary) hypertension: Secondary | ICD-10-CM | POA: Insufficient documentation

## 2020-10-07 ENCOUNTER — Encounter (INDEPENDENT_AMBULATORY_CARE_PROVIDER_SITE_OTHER): Payer: Self-pay | Admitting: Gastroenterology

## 2020-10-07 ENCOUNTER — Other Ambulatory Visit: Payer: Self-pay

## 2020-10-07 ENCOUNTER — Ambulatory Visit (INDEPENDENT_AMBULATORY_CARE_PROVIDER_SITE_OTHER): Payer: Medicare Other | Admitting: Gastroenterology

## 2020-10-07 VITALS — BP 123/73 | HR 112 | Temp 99.2°F | Ht 62.0 in | Wt 162.0 lb

## 2020-10-07 DIAGNOSIS — K219 Gastro-esophageal reflux disease without esophagitis: Secondary | ICD-10-CM | POA: Diagnosis not present

## 2020-10-07 DIAGNOSIS — K5900 Constipation, unspecified: Secondary | ICD-10-CM | POA: Diagnosis not present

## 2020-10-07 NOTE — Progress Notes (Signed)
Referring Provider: Celene Squibb, MD Primary Care Physician:  Celene Squibb, MD Primary GI Physician: Jenetta Downer  Chief Complaint  Patient presents with   Constipation    Follow up on constipation. Linzess was too expensive. Eating more fiber in diet. Has 2 -3 stools a week. States appetite is too good. Has gas and bloating, heartburn is controlled well with omeprazole.    HPI:   Eileen Mccoy is a 70 y.o. female with past medical history of asthma, GERD, H. Pylori, breast cancer s/p resection, and HTN.  Patient presenting today for follow up of GERD and constipation  GERD: omeprazole '20mg'$  once daily, well controlled. States no issues with reflux as long as she takes her PPI.  Constipation: Linzess 91mg prescribed at last visit but it was too expensive so patient did not get it, she is eating more fiber in diet and uses senokot as needed, states she increased intake of beans which has helped, has some occasional gas and bloating but she has learned which types of beans seem to cause more bloating and gas and she avoids those. States 2-3 BMs per week and she feels this is sufficient for her.  She has tried kiwi without good result, sometimes uses prunes but states they take a while to work.   No red flag symptoms. Patient denies melena, hematochezia, nausea, vomiting, diarrhea, constipation, dysphagia, odyonophagia, early satiety or weight loss.   Last Colonoscopy:(04/21/20)- One 2 mm polyp in the transverse colon(tubular adenoma)-Non-bleeding internal hemorrhoids.  Last Endoscopy:(2018)Normal esophagus. - Z-line irregular, 39 cm from the incisors. - Gastritis. Biopsied. Positive for HP. - Two gastric polyps. Biopsied - foveolar hyperplasia. - Normal duodenal bulb and second portion of the duodenum.  Recommendations:  Consider repeating colonoscopy in 7 years, will reevaluate at a later date given patient's age.   Past Medical History:  Diagnosis Date   Asthma 02/23/2016   Ductal  carcinoma in situ (DCIS) of right breast 01/21/2019   GERD (gastroesophageal reflux disease)    H. pylori infection    Hypertension     Past Surgical History:  Procedure Laterality Date   ABDOMINAL HYSTERECTOMY     BIOPSY  03/16/2016   Procedure: BIOPSY;  Surgeon: NRogene Houston MD;  [facility];  Service: Endoscopy;;  gastric biopsies   COLONOSCOPY WITH PROPOFOL N/A 04/21/2020   rehman: - One 2 mm polyp in the transverse colon, removed with a cold biopsy forceps (tubular adenoma), non bleeding internal hemorrhoids   ESOPHAGOGASTRODUODENOSCOPY N/A 03/16/2016   rehman: Normal esophagus. z line irregular 39 cm from incisors, gastritis, positive for Hp, treated with pylera, two gastric polyps, foveolar hyperplasia, normal duodenal bulb and second portion of duodenum   MASTECTOMY W/ SENTINEL NODE BIOPSY Right 03/11/2019   Procedure: RIGHT MASTECTOMY WITH RIGHT AXILLARY SENTINEL LYMPH NODE BIOPSY;  Surgeon: NAlphonsa Overall MD;  [place] [facility]  Service: General;  Laterality: Right;   [place] fibroids   POLYPECTOMY  03/16/2016   Procedure: POLYPECTOMY;  Surgeon: NRogene Houston MD;  [facility];  Service: Endoscopy;;  gastric polypectomies   POLYPECTOMY  04/21/2020   Procedure: POLYPECTOMY INTESTINAL;  Surgeon: CHarvel Quale MD;  [facility]: [facility];  Service: Gastroenterology;;  transverse colon polyp;    THYROIDECTOMY, PARTIAL      Current Outpatient Medications  Medication Sig Dispense Refill   acetaminophen (TYLENOL) 650 MG CR tablet Take 650 mg by mouth every 8 (  eight) hours as needed for pain.     Albuterol Sulfate 108 (90 Base) MCG/ACT AEPB Inhale 2 puffs into the lungs every 6 (six) hours as needed (shortness of breath). 1 each 1   Artificial Tear Solution (SOOTHE XP OP) Place 1 drop into both eyes in the morning and at bedtime.     cetirizine (ZYRTEC) 10 MG tablet Take 1 tablet (10 mg total) by  mouth daily.     Cholecalciferol (VITAMIN D3) 25 MCG (1000 UT) CAPS Take 1,000 Units by mouth daily.     Fluticasone-Salmeterol (ADVAIR) 250-50 MCG/DOSE AEPB Inhale 1 puff into the lungs 2 (two) times daily. 3 each 3   Garlic 123XX123 MG CAPS Take 1,000 mg by mouth daily.     hydrochlorothiazide (HYDRODIURIL) 25 MG tablet Take 25 mg by mouth daily.   3   losartan (COZAAR) 25 MG tablet Take 25 mg by mouth daily.     Multiple Vitamin (MULTIVITAMIN) tablet Take 1 tablet by mouth daily.     omeprazole (PRILOSEC) 20 MG capsule Take 1 capsule (20 mg total) by mouth daily. 90 capsule 3   Red Yeast Rice 600 MG CAPS Take 600 mg by mouth daily.     Sennosides-Docusate Sodium (SENNA S PO) Take 1 tablet by mouth 2 (two) times a week.     sodium chloride (OCEAN) 0.65 % SOLN nasal spray Place 1 spray into both nostrils as needed for congestion.     No current facility-administered medications for this visit.    Allergies as of 10/07/2020 - Review Complete 10/07/2020  Allergen Reaction Noted   Codeine  02/23/2016    Family History  Problem Relation Age of Onset   Heart disease Mother    Asthma Mother    COPD Mother 38       COPD   Cancer Father    Alcohol abuse Father    COPD Sister 59   Alcohol abuse Brother    Heart disease Sister 51    Social History   Socioeconomic History   Marital status: Married    Spouse name: Hendricks Milo   Number of children: 2   Years of education: 12   Highest education level: Not on file  Occupational History   Occupation: CNA  Tobacco Use   Smoking status: Never   Smokeless tobacco: Never  Vaping Use   Vaping Use: Never used  Substance and Sexual Activity   Alcohol use: Yes    Comment: occasionally   Drug use: No   Sexual activity: Yes  Other Topics Concern   Not on file  Social History Narrative   Married to Meckling   He is retired from [employer] is semi retired   2 daughters, two grandsons in [place] Strain: Not on [employer] Insecurity: Not on file  Transportation Needs: Not on file  Physical Activity: Not on file  Stress: Not on file  Social Connections: Not on file    Review of Systems: Gen: Denies fever, chills, anorexia. Denies fatigue, weakness, weight loss.  CV: Denies chest pain, palpitations, syncope, peripheral edema, and claudication. Resp: Denies dyspnea at rest, cough, wheezing, coughing up blood, and pleurisy. GI: Denies vomiting blood, jaundice, and fecal incontinence. Denies dysphagia or odynophagia. Derm: Denies rash, itching, dry skin Psych: Denies depression, anxiety, memory loss, confusion. No homicidal or suicidal ideation.  Heme: Denies bruising, bleeding, and enlarged lymph nodes.  Physical Exam: BP 123/73 (  BP Location: Left Arm, Patient Position: Sitting, Cuff Size: Large)   Pulse (!) 112   Temp 99.2 F (37.3 C) (Oral)   Ht '5\' 2"'$  (1.575 m)   Wt 162 lb (73.5 kg)   LMP 02/21/1976 (Approximate)   BMI 29.63 kg/m  General:   Alert and oriented. No distress noted. Pleasant and cooperative.  Head:  Normocephalic and atraumatic. Eyes:  Conjuctiva clear without scleral icterus. Mouth:  Oral mucosa pink and moist. Good dentition. No lesions. Heart: Normal rate and rhythm, s1 and s2 heart sounds present.  Lungs: Clear lung sounds in all lobes. Respirations equal and unlabored. Abdomen:  +BS, soft, non-tender and non-distended. No rebound or guarding. No HSM or masses noted. Derm: No palmar erythema or jaundice Msk:  Symmetrical without gross deformities. Normal posture. Extremities:  Without edema. Neurologic:  Alert and  oriented x4 Psych:  Alert and cooperative. Normal mood and affect.  ASSESSMENT: Keity Crespi is a 70 y.o. female presenting today for follow up of constipation and GERD.  Doing well on omeprazole '20mg'$  once daily, will continue with good result.  Constipation well controlled with increase of fiber in diet, 2-3  good BMs per week, takes senokot as needed maybe twice weekly. She is not interested in trying any medications to help with constipation as she feels it is managed well with diet. We discussed increasing fruits, veggies, exercise and water intake. If constipation becomes an issue again, she will let us know.   PLAN:  Continue omeprazole '20mg'$  once daily 2. Continue diet high in fiber, fruits, veggies, water and increase exercise for constipation 3. Plan for repeat colonoscopy in 7 years r/t hx of tubular adenoma.  Follow up as needed  Case discussed with Dr. Jenetta Downer who is in agreement with plan of care, as outlined above.   Deandre Brannan L. Alver Sorrow, MSN, APRN, AGNP-C Adult-Gerontology Nurse Practitioner [facility]

## 2020-10-07 NOTE — Patient Instructions (Addendum)
Continue omeprazole '20mg'$  once daily. Continue to manage your constipation with diet, good water intake and exercise, let us know if you begin having issues with constipation again.  Plan for repeat colonoscopy in 7 years.  Follow up s needed.

## 2020-10-18 DIAGNOSIS — I10 Essential (primary) hypertension: Secondary | ICD-10-CM | POA: Diagnosis not present

## 2020-10-20 DIAGNOSIS — K219 Gastro-esophageal reflux disease without esophagitis: Secondary | ICD-10-CM | POA: Diagnosis not present

## 2020-10-20 DIAGNOSIS — H25819 Combined forms of age-related cataract, unspecified eye: Secondary | ICD-10-CM | POA: Diagnosis not present

## 2020-10-20 DIAGNOSIS — H04123 Dry eye syndrome of bilateral lacrimal glands: Secondary | ICD-10-CM | POA: Diagnosis not present

## 2020-10-20 DIAGNOSIS — H02889 Meibomian gland dysfunction of unspecified eye, unspecified eyelid: Secondary | ICD-10-CM | POA: Diagnosis not present

## 2020-10-20 DIAGNOSIS — H40013 Open angle with borderline findings, low risk, bilateral: Secondary | ICD-10-CM | POA: Diagnosis not present

## 2020-10-20 DIAGNOSIS — E1165 Type 2 diabetes mellitus with hyperglycemia: Secondary | ICD-10-CM | POA: Diagnosis not present

## 2020-10-20 DIAGNOSIS — H5203 Hypermetropia, bilateral: Secondary | ICD-10-CM | POA: Diagnosis not present

## 2020-10-21 DIAGNOSIS — R7303 Prediabetes: Secondary | ICD-10-CM | POA: Insufficient documentation

## 2020-10-22 ENCOUNTER — Other Ambulatory Visit (HOSPITAL_COMMUNITY): Payer: Self-pay | Admitting: Family Medicine

## 2020-10-22 ENCOUNTER — Other Ambulatory Visit: Payer: Self-pay | Admitting: Family Medicine

## 2020-10-22 DIAGNOSIS — R221 Localized swelling, mass and lump, neck: Secondary | ICD-10-CM | POA: Insufficient documentation

## 2020-10-22 DIAGNOSIS — Z853 Personal history of malignant neoplasm of breast: Secondary | ICD-10-CM | POA: Diagnosis not present

## 2020-10-22 DIAGNOSIS — L723 Sebaceous cyst: Secondary | ICD-10-CM | POA: Diagnosis not present

## 2020-10-22 DIAGNOSIS — Z0001 Encounter for general adult medical examination with abnormal findings: Secondary | ICD-10-CM | POA: Diagnosis not present

## 2020-10-22 DIAGNOSIS — K219 Gastro-esophageal reflux disease without esophagitis: Secondary | ICD-10-CM | POA: Diagnosis not present

## 2020-11-01 ENCOUNTER — Ambulatory Visit (HOSPITAL_COMMUNITY)
Admission: RE | Admit: 2020-11-01 | Discharge: 2020-11-01 | Disposition: A | Discharge status: Home / Self Care | Payer: Medicare Other | Source: Ambulatory Visit | Attending: Family Medicine | Admitting: Family Medicine

## 2020-11-01 ENCOUNTER — Other Ambulatory Visit: Payer: Self-pay

## 2020-11-01 DIAGNOSIS — E890 Postprocedural hypothyroidism: Secondary | ICD-10-CM | POA: Diagnosis not present

## 2020-11-01 DIAGNOSIS — R221 Localized swelling, mass and lump, neck: Secondary | ICD-10-CM | POA: Diagnosis not present

## 2020-11-15 DIAGNOSIS — D2372 Other benign neoplasm of skin of left lower limb, including hip: Secondary | ICD-10-CM | POA: Diagnosis not present

## 2020-11-15 DIAGNOSIS — L7211 Pilar cyst: Secondary | ICD-10-CM | POA: Diagnosis not present

## 2020-11-23 DIAGNOSIS — Z23 Encounter for immunization: Secondary | ICD-10-CM | POA: Diagnosis not present

## 2021-01-17 DIAGNOSIS — D0511 Intraductal carcinoma in situ of right breast: Secondary | ICD-10-CM | POA: Diagnosis not present

## 2021-01-19 ENCOUNTER — Other Ambulatory Visit: Payer: Self-pay | Admitting: Surgery

## 2021-01-19 DIAGNOSIS — Z1231 Encounter for screening mammogram for malignant neoplasm of breast: Secondary | ICD-10-CM

## 2021-01-19 DIAGNOSIS — I10 Essential (primary) hypertension: Secondary | ICD-10-CM | POA: Diagnosis not present

## 2021-01-19 DIAGNOSIS — E782 Mixed hyperlipidemia: Secondary | ICD-10-CM | POA: Diagnosis not present

## 2021-03-01 ENCOUNTER — Other Ambulatory Visit: Payer: Self-pay

## 2021-03-01 ENCOUNTER — Ambulatory Visit
Admission: RE | Admit: 2021-03-01 | Discharge: 2021-03-01 | Disposition: A | Discharge status: Home / Self Care | Payer: Medicare Other | Source: Ambulatory Visit | Attending: Surgery | Admitting: Surgery

## 2021-03-01 DIAGNOSIS — Z1231 Encounter for screening mammogram for malignant neoplasm of breast: Secondary | ICD-10-CM | POA: Diagnosis not present

## 2021-03-01 HISTORY — DX: Malignant neoplasm of unspecified site of unspecified female breast: C50.919

## 2021-04-19 DIAGNOSIS — R7303 Prediabetes: Secondary | ICD-10-CM | POA: Diagnosis not present

## 2021-04-19 DIAGNOSIS — I10 Essential (primary) hypertension: Secondary | ICD-10-CM | POA: Diagnosis not present

## 2021-04-20 DIAGNOSIS — H40013 Open angle with borderline findings, low risk, bilateral: Secondary | ICD-10-CM | POA: Diagnosis not present

## 2021-04-22 DIAGNOSIS — R7303 Prediabetes: Secondary | ICD-10-CM | POA: Diagnosis not present

## 2021-04-22 DIAGNOSIS — E782 Mixed hyperlipidemia: Secondary | ICD-10-CM | POA: Diagnosis not present

## 2021-04-22 DIAGNOSIS — Z853 Personal history of malignant neoplasm of breast: Secondary | ICD-10-CM | POA: Diagnosis not present

## 2021-04-22 DIAGNOSIS — I10 Essential (primary) hypertension: Secondary | ICD-10-CM | POA: Diagnosis not present

## 2021-04-22 DIAGNOSIS — J4530 Mild persistent asthma, uncomplicated: Secondary | ICD-10-CM | POA: Diagnosis not present

## 2021-04-22 DIAGNOSIS — K219 Gastro-esophageal reflux disease without esophagitis: Secondary | ICD-10-CM | POA: Diagnosis not present

## 2021-06-07 IMAGING — MR MR BREAST BX W/ LOC DEV 1ST LEASION IMAGE BX SPEC MR GUIDE*R*
5 of 8 series · 27 of 48 positions shown · IV contrast (7ml gadavist)
Comparison: Previous exams.
COMPARISON: Previous exams.
COMPARISON: Previous exams.

Addendum:
CLINICAL DATA: 68-year-old with recent biopsy-proven DCIS involving
the RIGHT UPPER OUTER QUADRANT and the RIGHT UPPER INNER QUADRANT.
Diagnostic Breast MRI demonstrated non-mass enhancement extending
ANTERIOR and INFERIOR to the biopsy site in the UPPER OUTER
QUADRANT.

The ANTERIOR extent of the non-mass enhancement is sampled.
EXAM:
MRI GUIDED CORE NEEDLE BIOPSY OF THE RIGHT BREAST
TECHNIQUE: Multiplanar, multisequence MR imaging of the RIGHT breast was
performed both before and after administration of intravenous
contrast.
CONTRAST:  7 mL Gadavist (gadobutrol) IV.

[Series 2: fiducial unilateral · sagittal · 2.0mm · 1.33mm/px · 1 of 52 slices shown]
[im 1/52]
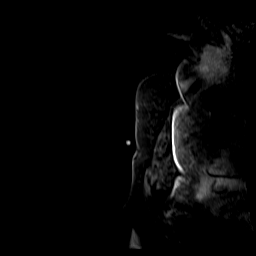

[Series 3: dynamic pre · axial · non-contrast · 1.3mm · 0.73mm/px · z∈[-72,+114]mm · 6 of 144 slices shown]
[im 1/144]
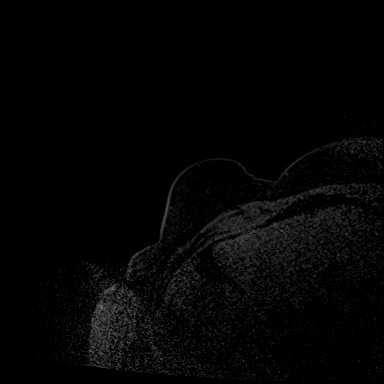
[im 29/144]
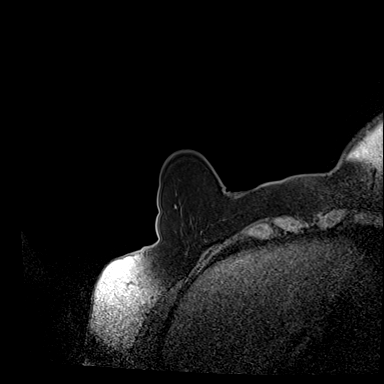
[im 58/144]
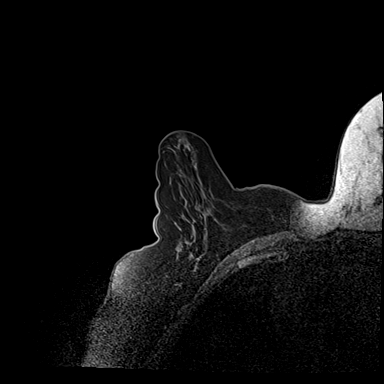
[im 86/144]
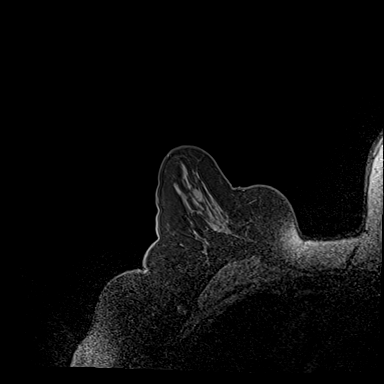
[im 115/144]
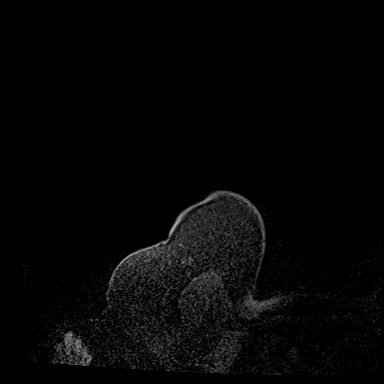
[im 144/144]
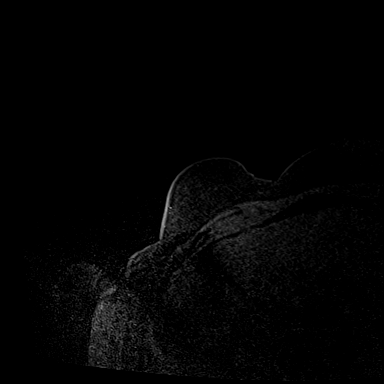

[Series 4: dynamic post 20 · axial · 1.3mm · 0.73mm/px · z∈[-72,+114]mm · 6 of 144 slices shown]
[im 1/144]
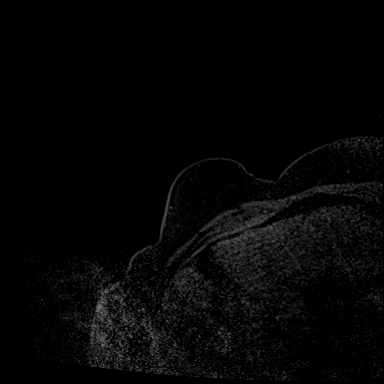
[im 29/144]
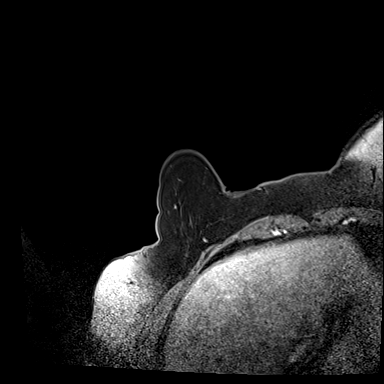
[im 58/144]
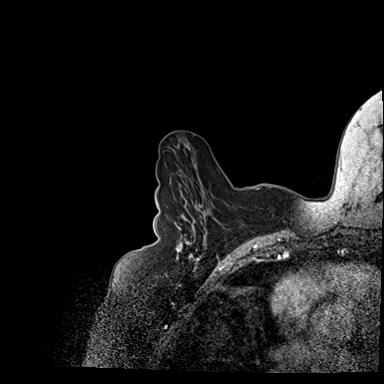
[im 86/144]
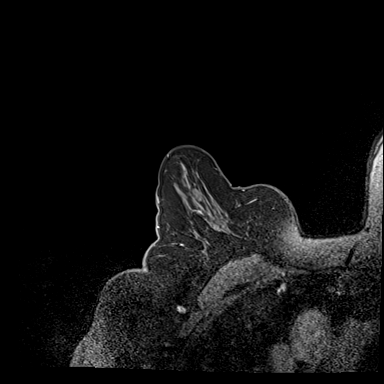
[im 115/144]
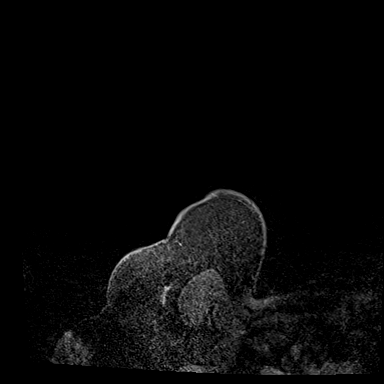
[im 144/144]
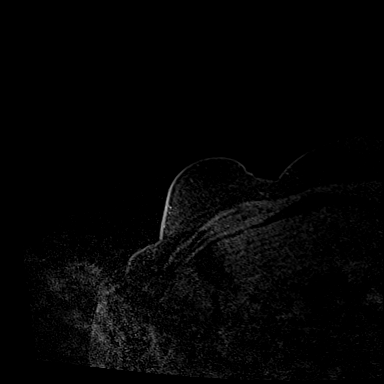

[Series 6: dynamic post 3 · axial · 1.3mm · 0.73mm/px · z∈[-72,+114]mm · 7 of 144 slices shown (1 of 2)]
[im 1/144]
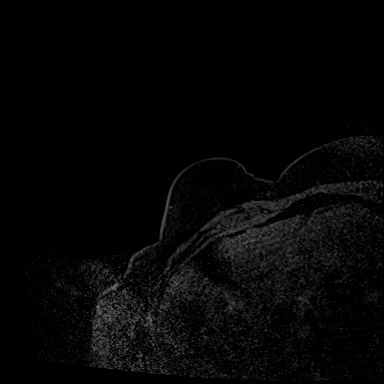
[im 24/144]
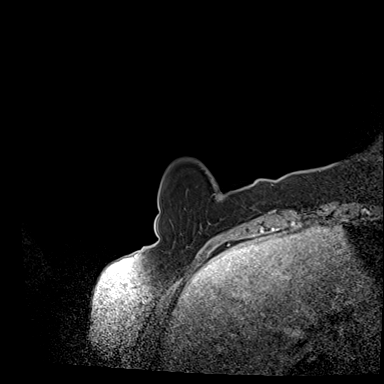
[im 48/144]
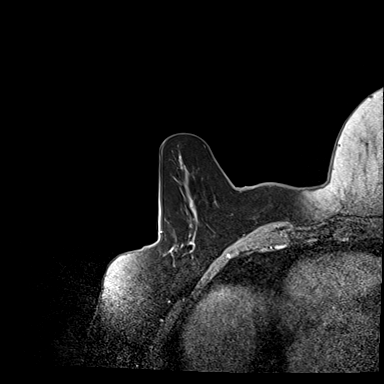
[im 72/144]
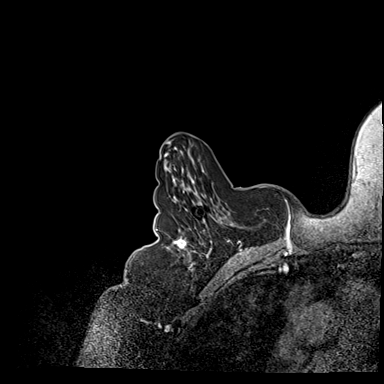
[im 96/144]
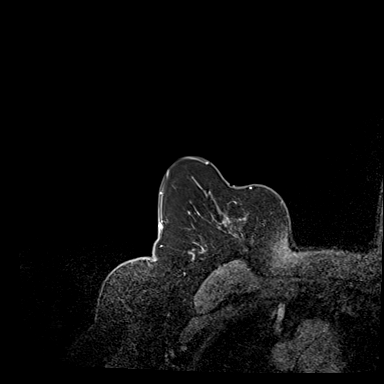
[im 120/144]
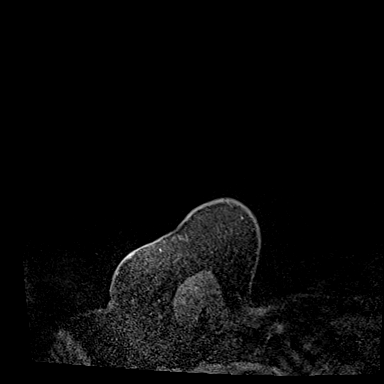
[im 144/144]
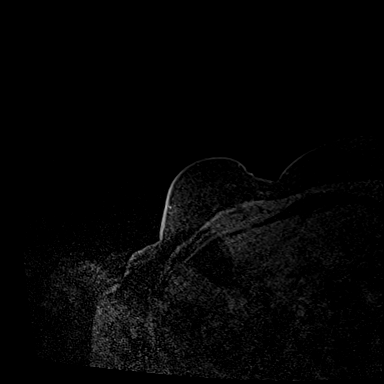

[Series 7: dynamic post 3 · axial · 1.3mm · 0.73mm/px · z∈[-72,+114]mm · 7 of 144 slices shown (2 of 2)]
[im 1/144]
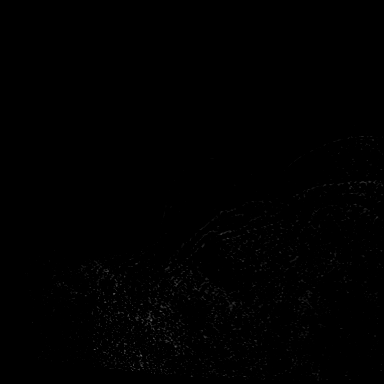
[im 24/144]
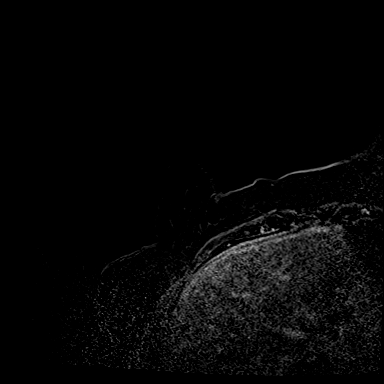
[im 48/144]
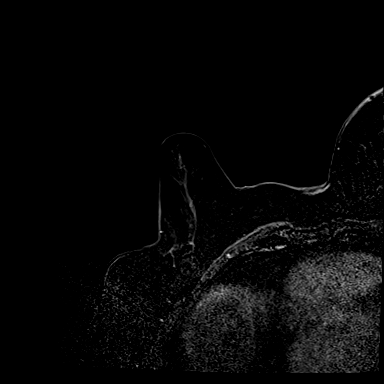
[im 72/144]
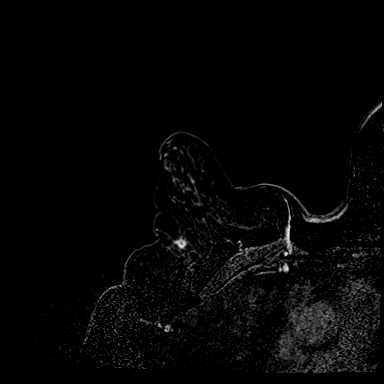
[im 96/144]
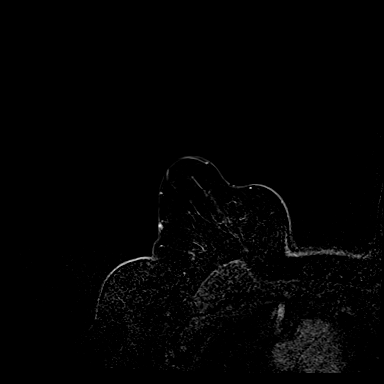
[im 120/144]
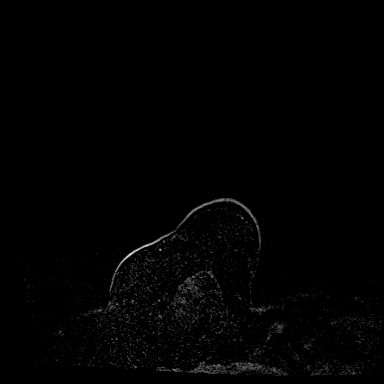
[im 144/144]
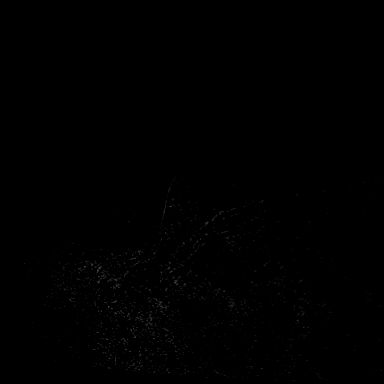

[27 of 48 positions shown; findings below may reference images not displayed]

FINDINGS: I met with the patient, and we discussed the procedure of MRI guided
biopsy, including risks, benefits, and alternatives. Specifically,
we discussed the risks of infection, bleeding, tissue injury, clip
migration, and inadequate sampling. Informed, written consent was
given. The usual time out protocol was performed immediately prior
to the procedure.

Using sterile technique with chlorhexidine as skin antisepsis, 1%
lidocaine and 1% lidocaine with epinephrine, using MRI guidance, a 9
gauge vacuum assisted device was used to perform biopsy of the
anterior extent of non-mass enhancement involving the UPPER OUTER
QUADRANT of the RIGHT breast using a lateral approach.

Lesion quadrant: UPPER OUTER QUADRANT.

At the conclusion of the procedure, a barbell shaped tissue marker
clip was deployed into the biopsy cavity. Follow-up 2-view mammogram
was performed and dictated separately.
IMPRESSION: MRI guided biopsy of the anterior extent of non-mass enhancement
involving the UPPER OUTER QUADRANT of the RIGHT breast.

ADDENDUM:
Pathology revealed HIGH GRADE DUCTAL CARCINOMA IN SITU,
CALCIFICATIONS AND NECROSIS of the RIGHT breast, upper outer
quadrant, anterior extent of non mass enhancement. This was found to
be concordant by Dr. Paulus N Ceejay.

Pathology results were discussed with the patient by telephone. The
patient reported doing well after the biopsy with tenderness at the
site. Post biopsy instructions and care were reviewed and questions
were answered. The patient was encouraged to call The [REDACTED]

The patient has a recent diagnosis of right breast cancer and should
follow her outlined treatment plan.

Pathology results reported by Shaukat Vos, RN on 02/12/2019.

ADDENDUM:
This addendum is to document a telephone conversation with Dr.
Mune that occurred today. Our conversation was with regard to the
possibility of a breast sparing procedure if the patient is adamant
that she does not want mastectomy.

In that case, the patient would need a quadrantectomy of the UPPER
OUTER QUADRANT of the RIGHT breast. He questioned how one might
bracket that with the radioactive seeds. My suggestion to him was
that I would use 3 seeds placed as follows:

# 1) ANTERIOR to the dumbbell-shaped tissue marker clip placed at
the time of MRI biopsy, as the dissection will need to go as far
ANTERIOR as the nipple.

# 2) POSTERIOR and LATERAL to the X shaped tissue marker clip (in
order to include the calcifications that are present POSTERIOR to
the clip).

# 3) POSTERIOR to the coil shaped tissue marker clip (including
POSTERIOR to the architectural distortion which is located posterior
to the coil clip), as the dissection will need to go as far
POSTERIOR as the chest wall.

*** End of Addendum ***
Addendum:
FINDINGS: I met with the patient, and we discussed the procedure of MRI guided
biopsy, including risks, benefits, and alternatives. Specifically,
we discussed the risks of infection, bleeding, tissue injury, clip
migration, and inadequate sampling. Informed, written consent was
given. The usual time out protocol was performed immediately prior
to the procedure.

Using sterile technique with chlorhexidine as skin antisepsis, 1%
lidocaine and 1% lidocaine with epinephrine, using MRI guidance, a 9
gauge vacuum assisted device was used to perform biopsy of the
anterior extent of non-mass enhancement involving the UPPER OUTER
QUADRANT of the RIGHT breast using a lateral approach.

Lesion quadrant: UPPER OUTER QUADRANT.

At the conclusion of the procedure, a barbell shaped tissue marker
clip was deployed into the biopsy cavity. Follow-up 2-view mammogram
was performed and dictated separately.
IMPRESSION: MRI guided biopsy of the anterior extent of non-mass enhancement
involving the UPPER OUTER QUADRANT of the RIGHT breast.

ADDENDUM:
Pathology revealed HIGH GRADE DUCTAL CARCINOMA IN SITU,
CALCIFICATIONS AND NECROSIS of the RIGHT breast, upper outer
quadrant, anterior extent of non mass enhancement. This was found to
be concordant by Dr. Paulus N Ceejay.

Pathology results were discussed with the patient by telephone. The
patient reported doing well after the biopsy with tenderness at the
site. Post biopsy instructions and care were reviewed and questions
were answered. The patient was encouraged to call The [REDACTED]

The patient has a recent diagnosis of right breast cancer and should
follow her outlined treatment plan.

Pathology results reported by Shaukat Vos, RN on 02/12/2019.

*** End of Addendum ***
FINDINGS: I met with the patient, and we discussed the procedure of MRI guided
biopsy, including risks, benefits, and alternatives. Specifically,
we discussed the risks of infection, bleeding, tissue injury, clip
migration, and inadequate sampling. Informed, written consent was
given. The usual time out protocol was performed immediately prior
to the procedure.

Using sterile technique with chlorhexidine as skin antisepsis, 1%
lidocaine and 1% lidocaine with epinephrine, using MRI guidance, a 9
gauge vacuum assisted device was used to perform biopsy of the
anterior extent of non-mass enhancement involving the UPPER OUTER
QUADRANT of the RIGHT breast using a lateral approach.

Lesion quadrant: UPPER OUTER QUADRANT.

At the conclusion of the procedure, a barbell shaped tissue marker
clip was deployed into the biopsy cavity. Follow-up 2-view mammogram
was performed and dictated separately.
IMPRESSION: MRI guided biopsy of the anterior extent of non-mass enhancement
involving the UPPER OUTER QUADRANT of the RIGHT breast.

## 2021-10-10 DIAGNOSIS — H04123 Dry eye syndrome of bilateral lacrimal glands: Secondary | ICD-10-CM | POA: Diagnosis not present

## 2021-10-10 DIAGNOSIS — H5203 Hypermetropia, bilateral: Secondary | ICD-10-CM | POA: Diagnosis not present

## 2021-10-10 DIAGNOSIS — H524 Presbyopia: Secondary | ICD-10-CM | POA: Diagnosis not present

## 2021-10-10 DIAGNOSIS — H40013 Open angle with borderline findings, low risk, bilateral: Secondary | ICD-10-CM | POA: Diagnosis not present

## 2021-10-10 DIAGNOSIS — H02889 Meibomian gland dysfunction of unspecified eye, unspecified eyelid: Secondary | ICD-10-CM | POA: Diagnosis not present

## 2021-10-10 DIAGNOSIS — H25813 Combined forms of age-related cataract, bilateral: Secondary | ICD-10-CM | POA: Diagnosis not present

## 2021-10-25 DIAGNOSIS — I10 Essential (primary) hypertension: Secondary | ICD-10-CM | POA: Diagnosis not present

## 2021-10-25 DIAGNOSIS — R7303 Prediabetes: Secondary | ICD-10-CM | POA: Diagnosis not present

## 2021-10-25 DIAGNOSIS — E782 Mixed hyperlipidemia: Secondary | ICD-10-CM | POA: Diagnosis not present

## 2021-10-30 DIAGNOSIS — E1165 Type 2 diabetes mellitus with hyperglycemia: Secondary | ICD-10-CM | POA: Insufficient documentation

## 2021-10-31 DIAGNOSIS — K219 Gastro-esophageal reflux disease without esophagitis: Secondary | ICD-10-CM | POA: Diagnosis not present

## 2021-10-31 DIAGNOSIS — E1165 Type 2 diabetes mellitus with hyperglycemia: Secondary | ICD-10-CM | POA: Diagnosis not present

## 2021-10-31 DIAGNOSIS — Z23 Encounter for immunization: Secondary | ICD-10-CM | POA: Diagnosis not present

## 2021-10-31 DIAGNOSIS — Z853 Personal history of malignant neoplasm of breast: Secondary | ICD-10-CM | POA: Diagnosis not present

## 2021-10-31 DIAGNOSIS — E782 Mixed hyperlipidemia: Secondary | ICD-10-CM | POA: Diagnosis not present

## 2021-10-31 DIAGNOSIS — Z0001 Encounter for general adult medical examination with abnormal findings: Secondary | ICD-10-CM | POA: Diagnosis not present

## 2021-10-31 DIAGNOSIS — Z6379 Other stressful life events affecting family and household: Secondary | ICD-10-CM | POA: Diagnosis not present

## 2021-11-09 DIAGNOSIS — H40013 Open angle with borderline findings, low risk, bilateral: Secondary | ICD-10-CM | POA: Diagnosis not present

## 2021-12-31 ENCOUNTER — Encounter (INDEPENDENT_AMBULATORY_CARE_PROVIDER_SITE_OTHER): Payer: Self-pay | Admitting: Gastroenterology

## 2022-01-16 ENCOUNTER — Other Ambulatory Visit: Payer: Self-pay | Admitting: Internal Medicine

## 2022-01-16 DIAGNOSIS — Z1231 Encounter for screening mammogram for malignant neoplasm of breast: Secondary | ICD-10-CM

## 2022-03-07 DIAGNOSIS — G245 Blepharospasm: Secondary | ICD-10-CM | POA: Insufficient documentation

## 2022-03-13 ENCOUNTER — Ambulatory Visit
Admission: RE | Admit: 2022-03-13 | Discharge: 2022-03-13 | Disposition: A | Discharge status: Home / Self Care | Payer: Medicare Other | Source: Ambulatory Visit | Attending: Internal Medicine | Admitting: Internal Medicine

## 2022-03-13 DIAGNOSIS — Z1231 Encounter for screening mammogram for malignant neoplasm of breast: Secondary | ICD-10-CM | POA: Diagnosis not present

## 2022-03-22 DIAGNOSIS — H02402 Unspecified ptosis of left eyelid: Secondary | ICD-10-CM | POA: Diagnosis not present

## 2022-04-21 DIAGNOSIS — E782 Mixed hyperlipidemia: Secondary | ICD-10-CM | POA: Diagnosis not present

## 2022-04-21 DIAGNOSIS — R7303 Prediabetes: Secondary | ICD-10-CM | POA: Diagnosis not present

## 2022-04-21 DIAGNOSIS — I10 Essential (primary) hypertension: Secondary | ICD-10-CM | POA: Diagnosis not present

## 2022-05-01 DIAGNOSIS — J4530 Mild persistent asthma, uncomplicated: Secondary | ICD-10-CM | POA: Diagnosis not present

## 2022-05-01 DIAGNOSIS — Z0001 Encounter for general adult medical examination with abnormal findings: Secondary | ICD-10-CM | POA: Diagnosis not present

## 2022-05-01 DIAGNOSIS — E782 Mixed hyperlipidemia: Secondary | ICD-10-CM | POA: Diagnosis not present

## 2022-05-01 DIAGNOSIS — E1169 Type 2 diabetes mellitus with other specified complication: Secondary | ICD-10-CM | POA: Diagnosis not present

## 2022-05-01 DIAGNOSIS — K219 Gastro-esophageal reflux disease without esophagitis: Secondary | ICD-10-CM | POA: Diagnosis not present

## 2022-05-01 DIAGNOSIS — I10 Essential (primary) hypertension: Secondary | ICD-10-CM | POA: Diagnosis not present

## 2022-05-01 DIAGNOSIS — Z7951 Long term (current) use of inhaled steroids: Secondary | ICD-10-CM | POA: Diagnosis not present

## 2022-05-01 DIAGNOSIS — Z79899 Other long term (current) drug therapy: Secondary | ICD-10-CM | POA: Diagnosis not present

## 2022-05-01 DIAGNOSIS — G245 Blepharospasm: Secondary | ICD-10-CM | POA: Diagnosis not present

## 2022-05-02 ENCOUNTER — Ambulatory Visit: Payer: Medicare Other | Admitting: Neurology

## 2022-05-02 ENCOUNTER — Encounter: Payer: Self-pay | Admitting: Neurology

## 2022-05-02 VITALS — BP 165/92 | HR 86 | Ht 62.6 in | Wt 164.0 lb

## 2022-05-02 DIAGNOSIS — G5132 Clonic hemifacial spasm, left: Secondary | ICD-10-CM | POA: Diagnosis not present

## 2022-05-02 NOTE — Progress Notes (Addendum)
Chief Complaint  Patient presents with   New Patient (Initial Visit)    Rm 13. Accompanied by daughter, Eileen Mccoy. NP Paper proficient referral for Blepharospasm / Valentino Nose NP. States blepharospasm of left eye is proceeded by a shooting pain in her jaw.      ASSESSMENT AND PLAN  Eileen Mccoy is a 72 y.o. female  Left hemifacial spasm  Only occasionally left lower eyelid muscle spasm, low lying left nasolabial fold , which has been present for a long time,  Discussed with patient, overall she is doing well, we will continue observe her symptoms, decided to hold off MRI of the brain, and botulism toxin prior authorization process now   DIAGNOSTIC DATA (LABS, IMAGING, TESTING) - I reviewed patient records, labs, notes, testing and imaging myself where available. Addendum: Laboratory evaluations on April 23, 2022: A1c 6.4, LDL 164, normal CMP creatinine 0.85  MEDICAL HISTORY:  Eileen Mccoy, is a 72 year old female, accompanied by her daughter, seen in request by her primary care doctor Celene Squibb for evaluation of left facial twitching, but initial evaluation was on May 02, 2022  I reviewed and summarized the referring note. PMHX HTN HLD Right breast Cancer, s/p lobectomy H. Pylori Parathyroidectomy--presented with extreme fatigue, abnormal calcium level  She went through very stressful few months in the middle of 2023, lost her younger sister, at the end of 2023, she was noted to have frequent left eye muscle twitching, at his worst, she has constant left eyelid muscle twitching, but denied blocking her vision, since beginning of 2024, her symptoms has much improved, only have occasionally muscle twitching,  Prior to this onset, she had a few episode of transient shooting pain to her left jaw, now has disappeared  She denies visual loss, no hearing loss, no balance issues, she was seen by ophthalmologist on March 28, 2022, no significant abnormality  found   PHYSICAL EXAM:   Vitals:   05/02/22 1403 05/02/22 1409  BP: (!) 176/82 (!) 165/92  Pulse: 84 86  Weight: 164 lb (74.4 kg)   Height: 5' 2.6" (1.59 m)    Not recorded     Body mass index is 29.42 kg/m.  PHYSICAL EXAMNIATION:  Gen: NAD, conversant, well nourised, well groomed                     Cardiovascular: Regular rate rhythm, no peripheral edema, warm, nontender. Eyes: Conjunctivae clear without exudates or hemorrhage Neck: Supple, no carotid bruits. Pulmonary: Clear to auscultation bilaterally   NEUROLOGICAL EXAM:  MENTAL STATUS: Speech/cognition: Awake, alert, oriented to history taking and casual conversation CRANIAL NERVES: CN II: Visual fields are full to confrontation. Pupils are round equal and briskly reactive to light. CN III, IV, VI: extraocular movement are normal. No ptosis. CN V: Facial sensation is intact to light touch CN VII: Low-lying left lower nasolabial fold, normal facial movement, occasionally left lower eyelid muscle twitching CN VIII: Hearing is normal to causal conversation.  Bilateral tympanic membrane pearly looking, CN IX, X: Phonation is normal. CN XI: Head turning and shoulder shrug are intact  MOTOR: There is no pronator drift of out-stretched arms. Muscle bulk and tone are normal. Muscle strength is normal.  REFLEXES: Reflexes are 2+ and symmetric at the biceps, triceps, knees, and ankles. Plantar responses are flexor.  SENSORY: Intact to light touch, pinprick and vibratory sensation are intact in fingers and toes.  COORDINATION: There is no trunk or limb dysmetria noted.  GAIT/STANCE:  Posture is normal. Gait is steady with normal steps, base, arm swing, and turning. Heel and toe walking are normal. Tandem gait is normal.  Romberg is absent.  REVIEW OF SYSTEMS:  Full 14 system review of systems performed and notable only for as above All other review of systems were negative.   ALLERGIES: Allergies  Allergen  Reactions   Codeine     Stomach ache, nausea    HOME MEDICATIONS: Current Outpatient Medications  Medication Sig Dispense Refill   acetaminophen (TYLENOL) 650 MG CR tablet Take 650 mg by mouth every 8 (eight) hours as needed for pain.     albuterol (2.5 MG/3ML) 0.083% NEBU 3 mL, albuterol (5 MG/ML) 0.5% NEBU 0.5 mL Inhale into the lungs.     Albuterol Sulfate 108 (90 Base) MCG/ACT AEPB Inhale 2 puffs into the lungs every 6 (six) hours as needed (shortness of breath). 1 each 1   Artificial Tear Solution (SOOTHE XP OP) Place 1 drop into both eyes in the morning and at bedtime.     cetirizine (ZYRTEC) 10 MG tablet Take 10 mg by mouth daily as needed.     Cholecalciferol (VITAMIN D3) 25 MCG (1000 UT) CAPS Take 1,000 Units by mouth daily.     Fluticasone-Salmeterol (WIXELA INHUB IN) Inhale 2 puffs into the lungs.     hydrochlorothiazide (HYDRODIURIL) 25 MG tablet Take 25 mg by mouth daily.   3   losartan (COZAAR) 25 MG tablet Take 25 mg by mouth daily.     Multiple Vitamin (MULTIVITAMIN) tablet Take 1 tablet by mouth daily.     OMEGA-3 FATTY ACIDS PO Take by mouth daily. 2000 mg daily     omeprazole (PRILOSEC) 20 MG capsule Take 1 capsule (20 mg total) by mouth daily. 90 capsule 3   pravastatin (PRAVACHOL) 10 MG tablet Take 10 mg by mouth at bedtime.     Sennosides-Docusate Sodium (SENNA S PO) Take 1 tablet by mouth 2 (two) times daily as needed.     sodium chloride (OCEAN) 0.65 % SOLN nasal spray Place 1 spray into both nostrils as needed for congestion.     No current facility-administered medications for this visit.    PAST MEDICAL HISTORY: Past Medical History:  Diagnosis Date   Asthma 02/23/2016   Breast cancer ([place])    Ductal carcinoma in situ (DCIS) of right breast 01/21/2019   GERD (gastroesophageal reflux disease)    H. pylori infection    Hypertension    Mixed hyperlipidemia 09/24/2020    PAST SURGICAL HISTORY: Past Surgical History:  Procedure Laterality Date   ABDOMINAL  HYSTERECTOMY     BIOPSY  03/16/2016   Procedure: BIOPSY;  Surgeon: Rogene Houston, MD;  [facility];  Service: Endoscopy;;  gastric biopsies   COLONOSCOPY WITH PROPOFOL N/A 04/21/2020   rehman: - One 2 mm polyp in the transverse colon, removed with a cold biopsy forceps (tubular adenoma), non bleeding internal hemorrhoids   ESOPHAGOGASTRODUODENOSCOPY N/A 03/16/2016   rehman: Normal esophagus. z line irregular 39 cm from incisors, gastritis, positive for Hp, treated with pylera, two gastric polyps, foveolar hyperplasia, normal duodenal bulb and second portion of duodenum   MASTECTOMY Right    MASTECTOMY W/ SENTINEL NODE BIOPSY Right 03/11/2019   Procedure: RIGHT MASTECTOMY WITH RIGHT AXILLARY SENTINEL LYMPH NODE BIOPSY;  Surgeon: Alphonsa Overall, MD;  Location: [place];  Service: General;  Laterality: Right;   Sneads for fibroids   POLYPECTOMY  03/16/2016   Procedure: POLYPECTOMY;  Surgeon: Rogene Houston, MD;  [facility];  Service: Endoscopy;;  gastric polypectomies   POLYPECTOMY  04/21/2020   Procedure: POLYPECTOMY INTESTINAL;  Surgeon: Harvel Quale, MD;  [facility];  Service: Gastroenterology;;  transverse colon polyp;    THYROIDECTOMY, PARTIAL      FAMILY HISTORY: Family History  Problem Relation Age of Onset   Heart disease Mother    Asthma Mother    COPD Mother 68       COPD   Heart attack Mother    Cancer Father    Alcohol abuse Father    Lung cancer Father    COPD Sister 68   Heart disease Sister 81   Alcohol abuse Brother    Breast cancer Neg Hx     SOCIAL HISTORY: Social History   Socioeconomic History   Marital status: Married    Spouse name: [place]ricks Milo   Number of children: 2   Years of education: 12   Highest education level: Not on file  Occupational History   Occupation: CNA  Tobacco Use   Smoking status: Never   Smokeless tobacco: Never  Vaping Use   Vaping Use:  Never used  Substance and Sexual Activity   Alcohol use: Yes    Comment: wine on holiday   Drug use: No   Sexual activity: Yes  Other Topics Concern   Not on file  Social History Narrative   Married to [place]   He is retired from [employer] is semi retired   2 daughters, two grandsons in [place]   12 years of education + CNA License 9 months   Social Determinants of Radio broadcast assistant Strain: Not on file  Food Insecurity: Not on file  Transportation Needs: No Transportation Needs (01/22/2019)   PRAPARE - Hydrologist (Medical): No    Lack of Transportation (Non-Medical): No  Physical Activity: Insufficiently Active (01/02/2017)   Exercise Vital Sign    Days of Exercise per Week: 2 days    Minutes of Exercise per Session: 30 min  Stress: Not on file  Social Connections: Not on file  Intimate Partner Violence: Not At Risk (01/22/2019)   Humiliation, Afraid, Rape, and Kick questionnaire    Fear of Current or Ex-Partner: No    Emotionally Abused: No    Physically Abused: No    Sexually Abused: No      Marcial Pacas, M.D. Ph.D.  [facility] [address], [place], [place] [address] Ph: [phone] Fax: [phone]  CC:  Valentino Nose, FNP [address] [place],   [address]  Celene Squibb, MD

## 2022-06-11 ENCOUNTER — Other Ambulatory Visit: Payer: Self-pay

## 2022-06-11 ENCOUNTER — Emergency Department (HOSPITAL_COMMUNITY): Payer: Medicare Other

## 2022-06-11 ENCOUNTER — Encounter (HOSPITAL_COMMUNITY): Payer: Self-pay | Admitting: Emergency Medicine

## 2022-06-11 ENCOUNTER — Emergency Department (HOSPITAL_COMMUNITY)
Admission: EM | Admit: 2022-06-11 | Discharge: 2022-06-11 | Disposition: A | Discharge status: Home / Self Care | Payer: Medicare Other | Attending: Emergency Medicine | Admitting: Emergency Medicine

## 2022-06-11 DIAGNOSIS — R29818 Other symptoms and signs involving the nervous system: Secondary | ICD-10-CM | POA: Diagnosis not present

## 2022-06-11 DIAGNOSIS — J45909 Unspecified asthma, uncomplicated: Secondary | ICD-10-CM | POA: Diagnosis not present

## 2022-06-11 DIAGNOSIS — R42 Dizziness and giddiness: Secondary | ICD-10-CM | POA: Diagnosis not present

## 2022-06-11 DIAGNOSIS — I10 Essential (primary) hypertension: Secondary | ICD-10-CM | POA: Diagnosis not present

## 2022-06-11 DIAGNOSIS — Z853 Personal history of malignant neoplasm of breast: Secondary | ICD-10-CM | POA: Diagnosis not present

## 2022-06-11 LAB — BASIC METABOLIC PANEL
Anion gap: 11 (ref 5–15)
BUN: 13 mg/dL (ref 8–23)
CO2: 27 mmol/L (ref 22–32)
Calcium: 9.4 mg/dL (ref 8.9–10.3)
Chloride: 102 mmol/L (ref 98–111)
Creatinine, Ser: 0.89 mg/dL (ref 0.44–1.00)
GFR, Estimated: >60 mL/min (ref >60)
Glucose, Bld: 93 mg/dL (ref 70–99)
Potassium: 4.6 mmol/L (ref 3.5–5.1)
Sodium: 140 mmol/L (ref 135–145)

## 2022-06-11 LAB — URINALYSIS, ROUTINE W REFLEX MICROSCOPIC
Bilirubin Urine: NEGATIVE
Glucose, UA: NEGATIVE mg/dL
Hgb urine dipstick: NEGATIVE
Ketones, ur: NEGATIVE mg/dL
Leukocytes,Ua: NEGATIVE
Nitrite: NEGATIVE
Protein, ur: NEGATIVE mg/dL
Specific Gravity, Urine: 1.003 — ABNORMAL LOW (ref 1.005–1.030)
pH: 6 (ref 5.0–8.0)

## 2022-06-11 LAB — CBC
HCT: 36.8 % (ref 36.0–46.0)
Hemoglobin: 12 g/dL (ref 12.0–15.0)
MCH: 28.4 pg (ref 26.0–34.0)
MCHC: 32.6 g/dL (ref 30.0–36.0)
MCV: 87 fL (ref 80.0–100.0)
Platelets: 372 10*3/uL (ref 150–400)
RBC: 4.23 MIL/uL (ref 3.87–5.11)
RDW: 14.5 % (ref 11.5–15.5)
WBC: 9.1 10*3/uL (ref 4.0–10.5)
nRBC: 0 % (ref 0.0–0.2)

## 2022-06-11 MED ORDER — MECLIZINE HCL 25 MG PO TABS: 25.0000 mg | ORAL_TABLET | Freq: Three times a day (TID) | ORAL | 0 refills | Status: AC | PRN

## 2022-06-11 MED ORDER — SODIUM CHLORIDE 0.9 % IV BOLUS
500.0000 mL | Freq: Once | INTRAVENOUS | Status: AC
  Administered 2022-06-11: 500 mL via INTRAVENOUS

## 2022-06-11 NOTE — ED Provider Notes (Signed)
Emergency Department Provider Note   I have reviewed the triage vital signs and the nursing notes.   HISTORY  Chief Complaint Dizziness   HPI Eileen Mccoy is a 72 y.o. female past history reviewed below including hypertension and high cholesterol presents to the emergency department with intermittent acute onset vertigo symptoms.  Yesterday morning at around 8 AM she had sudden severe spinning sensation causing her to nearly fall.  Family had to catch her.  She felt very nauseated and EMS was called.  She ultimately did not want to be seen in the hospital and symptoms seem to improve but then had a second similar episode although less severe this morning at 11 AM.  No clear provoking factors.  No current vertigo.  No unilateral weakness or numbness.  No speech changes or headaches.  No tinnitus or otalgia. No prior history of CVA.    Past Medical History:  Diagnosis Date   Asthma 02/23/2016   Breast cancer    Ductal carcinoma in situ (DCIS) of right breast 01/21/2019   GERD (gastroesophageal reflux disease)    H. pylori infection    Hypertension    Mixed hyperlipidemia 09/24/2020    Review of Systems  Constitutional: No fever/chills Eyes: No visual changes. ENT: No sore throat. Positive vertigo.  Cardiovascular: Denies chest pain. Respiratory: Denies shortness of breath. Gastrointestinal: No abdominal pain. Positive nausea, no vomiting.   Musculoskeletal: Negative for back pain. Skin: Negative for rash. Neurological: Negative for headaches, focal weakness or numbness.   ____________________________________________   PHYSICAL EXAM:  VITAL SIGNS: ED Triage Vitals  Enc Vitals Group     BP 06/11/22 1451 (!) 160/79     Pulse Rate 06/11/22 1451 82     Resp 06/11/22 1451 18     Temp 06/11/22 1451 98 F (36.7 C)     Temp Source 06/11/22 1451 Oral     SpO2 06/11/22 1451 100 %     Weight 06/11/22 1505 165 lb (74.8 kg)     Height 06/11/22 1505  (1.575 m)    Constitutional: Alert and oriented. Well appearing and in no acute distress. Eyes: Conjunctivae are normal. PERRL. EOMI. No nystagmus.  Head: Atraumatic. Ears:  Healthy appearing ear canals and TMs bilaterally Nose: No congestion/rhinnorhea. Mouth/Throat: Mucous membranes are moist.   Neck: No stridor.   Cardiovascular: Normal rate, regular rhythm. Good peripheral circulation. Grossly normal heart sounds.   Respiratory: Normal respiratory effort.  No retractions. Lungs CTAB. Gastrointestinal: Soft and nontender. No distention.  Musculoskeletal: No lower extremity tenderness nor edema. No gross deformities of extremities. Neurologic:  Normal speech and language. 5/5 strength in the bilateral upper/lower extremities. No drift. Normal finger to nose and heel to shin.  Skin:  Skin is warm, dry and intact. No rash noted.   ____________________________________________   LABS (all labs ordered are listed, but only abnormal results are displayed)  Labs Reviewed  URINALYSIS, ROUTINE W REFLEX MICROSCOPIC - Abnormal; Notable for the following components:      Result Value   Color, Urine COLORLESS (*)    Specific Gravity, Urine 1.003 (*)    All other components within normal limits  BASIC METABOLIC PANEL  CBC  CBG MONITORING, ED   ____________________________________________  EKG   EKG Interpretation  Date/Time:  Sunday June 11 2022 14:38:01 EDT Ventricular Rate:  85 PR Interval:  162 QRS Duration: 68 QT Interval:  392 QTC Calculation: 466 R Axis:   -3 Text Interpretation: Normal sinus rhythm Cannot  rule out Anterior infarct , age undetermined Abnormal ECG When compared with ECG of 07-Mar-2019 10:57, PREVIOUS ECG IS PRESENT Confirmed by Alona Bene [phone]) on 06/11/2022 4:48:12 PM        ____________________________________________  RADIOLOGY  MR BRAIN WO CONTRAST  Result Date: 06/11/2022 CLINICAL DATA:  Neuro deficit, stroke suspected. EXAM: MRI HEAD WITHOUT CONTRAST  TECHNIQUE: Multiplanar, multiecho pulse sequences of the brain and surrounding structures were obtained without intravenous contrast. COMPARISON:  None Available. FINDINGS: Brain: There is no acute intracranial hemorrhage, extra-axial fluid collection, or acute infarct. Parenchymal volume is normal. The ventricles are normal in size. Parenchymal signal is normal, with no significant burden of underlying chronic small-vessel ischemic change. The pituitary and suprasellar region are normal. There is no mass lesion. There is no mass effect or midline shift. Vascular: Normal flow voids. Skull and upper cervical spine: Normal marrow signal. Sinuses/Orbits: The paranasal sinuses are clear. The globes and orbits are unremarkable. Other: There is a 0.9 cm probable sebaceous cyst in the right temporal scalp. IMPRESSION: Unremarkable appearance of the brain with no acute intracranial pathology. Electronically Signed   By: Lesia Hausen M.D.   On: 06/11/2022 18:51    ____________________________________________   PROCEDURES  Procedure(s) performed:   Procedures  None  ____________________________________________   INITIAL IMPRESSION / ASSESSMENT AND PLAN / ED COURSE  Pertinent labs & imaging results that were available during my care of the patient were reviewed by me and considered in my medical decision making (see chart for details).   This patient is Presenting for Evaluation of vertigo, which does require a range of treatment options, and is a complaint that involves a high risk of morbidity and mortality.  The Differential Diagnoses include BPPV, Mnire's disease, central vetigo, near syncope, etc.  I did obtain Additional Historical Information from daughter at bedside.    Clinical Laboratory Tests Ordered, included CBC without anemia. No AKI on BMP.   Radiologic Tests Ordered, included MRI brain. I independently interpreted the images and agree with radiology interpretation.   Cardiac  Monitor Tracing which shows NSR.    Social Determinants of Health Risk patient is a non-smoker.   Medical Decision Making: Summary:  Patient presents emergency department for evaluation of acute onset vertigo both yesterday and today.  No acute neurochanges but also not symptomatic at this time.  Has multiple stroke risk factors.  I do suspect peripheral vertigo but given her age and risk factors plan for MRI brain to assess for central process.   Reevaluation with update and discussion with patient and family. MRI without CVA. Labs and UA reassuring. Patient feeling improved after IVF. Will d/c home with PRN meclizine and ENT contact information to call if symptoms continue. HTN here but no findings to suggest acute HTN emergency. Will continue home meds and advised close PCP follow up.   Considered admission but symptoms well controlled and no evidence of CVA.   Patient's presentation is most consistent with acute presentation with potential threat to life or bodily function.   Disposition: discharge  ____________________________________________  FINAL CLINICAL IMPRESSION(S) / ED DIAGNOSES  Final diagnoses:  Vertigo    NEW OUTPATIENT MEDICATIONS STARTED DURING THIS VISIT:  New Prescriptions   MECLIZINE (ANTIVERT) 25 MG TABLET    Take 1 tablet (25 mg total) by mouth 3 (three) times daily as needed for dizziness.    Note:  This document was prepared using Dragon voice recognition software and may include unintentional dictation errors.  Alona Bene, MD, [facility]  Emergency Medicine    Anothy Bufano, Arlyss Repress, MD 06/11/22 [phone]

## 2022-06-11 NOTE — ED Notes (Signed)
Patient transported to MRI 

## 2022-06-11 NOTE — ED Triage Notes (Signed)
Patient arrives ambulatory by POV with daughter states yesterday patient woke up around 7 am and was walking around. At about 8am patient started c/o dizziness and states the room was spinning. Daughter states patient had a near syncopal episode and sat her down. States patient was coherent and able to speak. Ems came to evaluate patient. Reports pt was hypertensive but had not taken her morning meds yet.

## 2022-06-11 NOTE — Discharge Instructions (Signed)
You were seen in the emergency room today with vertigo symptoms.  Your MRI did not show evidence of a stroke.  Your urine test was normal and labs are reassuring.  I am starting you on a medicine, meclizine, which you can take if your dizziness returns.  Do not take this daily unless you have multiple episodes.  I have also listed the name of an ear nose and throat doctor on this form to call for follow-up should your symptoms continue.  If you develop any severe symptoms that do not resolve with medication, severe headache, numbness/weakness you should return to the emergency department for reevaluation.

## 2022-06-14 ENCOUNTER — Telehealth: Payer: Self-pay

## 2022-06-14 NOTE — Telephone Encounter (Signed)
     Patient  visit on 4/21  at [place]   Have you been able to follow up with your primary care physician? Yes    The patient was or was not able to obtain any needed medicine or equipment. Yes   Are there diet recommendations that you are having difficulty following? Na   Patient expresses understanding of discharge instructions and education provided has no other needs at this time.  Yes     [facility], [place] [phone] [address], [place], [place] [address] Phone: [phone] Email: [email or web address]    

## 2022-06-26 DIAGNOSIS — I10 Essential (primary) hypertension: Secondary | ICD-10-CM | POA: Diagnosis not present

## 2022-06-26 DIAGNOSIS — R42 Dizziness and giddiness: Secondary | ICD-10-CM | POA: Diagnosis not present

## 2022-07-07 DIAGNOSIS — R42 Dizziness and giddiness: Secondary | ICD-10-CM | POA: Diagnosis not present

## 2022-07-07 DIAGNOSIS — H9313 Tinnitus, bilateral: Secondary | ICD-10-CM | POA: Diagnosis not present

## 2022-07-10 DIAGNOSIS — H903 Sensorineural hearing loss, bilateral: Secondary | ICD-10-CM | POA: Diagnosis not present

## 2022-08-28 DIAGNOSIS — E782 Mixed hyperlipidemia: Secondary | ICD-10-CM | POA: Diagnosis not present

## 2022-08-28 DIAGNOSIS — E1169 Type 2 diabetes mellitus with other specified complication: Secondary | ICD-10-CM | POA: Diagnosis not present

## 2022-08-30 DIAGNOSIS — Z853 Personal history of malignant neoplasm of breast: Secondary | ICD-10-CM | POA: Diagnosis not present

## 2022-08-30 DIAGNOSIS — K219 Gastro-esophageal reflux disease without esophagitis: Secondary | ICD-10-CM | POA: Diagnosis not present

## 2022-08-30 DIAGNOSIS — Z713 Dietary counseling and surveillance: Secondary | ICD-10-CM | POA: Diagnosis not present

## 2022-08-30 DIAGNOSIS — E782 Mixed hyperlipidemia: Secondary | ICD-10-CM | POA: Diagnosis not present

## 2022-08-30 DIAGNOSIS — I10 Essential (primary) hypertension: Secondary | ICD-10-CM | POA: Diagnosis not present

## 2022-08-30 DIAGNOSIS — J4530 Mild persistent asthma, uncomplicated: Secondary | ICD-10-CM | POA: Diagnosis not present

## 2022-08-30 DIAGNOSIS — Z7182 Exercise counseling: Secondary | ICD-10-CM | POA: Diagnosis not present

## 2022-08-30 DIAGNOSIS — E1169 Type 2 diabetes mellitus with other specified complication: Secondary | ICD-10-CM | POA: Diagnosis not present

## 2022-08-30 DIAGNOSIS — R011 Cardiac murmur, unspecified: Secondary | ICD-10-CM | POA: Diagnosis not present

## 2022-08-30 DIAGNOSIS — Z79899 Other long term (current) drug therapy: Secondary | ICD-10-CM | POA: Diagnosis not present

## 2022-10-31 DIAGNOSIS — H40013 Open angle with borderline findings, low risk, bilateral: Secondary | ICD-10-CM | POA: Diagnosis not present

## 2022-10-31 DIAGNOSIS — H25813 Combined forms of age-related cataract, bilateral: Secondary | ICD-10-CM | POA: Diagnosis not present

## 2022-10-31 DIAGNOSIS — H04123 Dry eye syndrome of bilateral lacrimal glands: Secondary | ICD-10-CM | POA: Diagnosis not present

## 2022-10-31 DIAGNOSIS — H02889 Meibomian gland dysfunction of unspecified eye, unspecified eyelid: Secondary | ICD-10-CM | POA: Diagnosis not present

## 2022-11-06 DIAGNOSIS — J069 Acute upper respiratory infection, unspecified: Secondary | ICD-10-CM | POA: Diagnosis not present

## 2022-11-06 DIAGNOSIS — R059 Cough, unspecified: Secondary | ICD-10-CM | POA: Diagnosis not present

## 2022-11-15 DIAGNOSIS — Z23 Encounter for immunization: Secondary | ICD-10-CM | POA: Diagnosis not present

## 2022-12-26 DIAGNOSIS — E782 Mixed hyperlipidemia: Secondary | ICD-10-CM | POA: Diagnosis not present

## 2022-12-26 DIAGNOSIS — E1169 Type 2 diabetes mellitus with other specified complication: Secondary | ICD-10-CM | POA: Diagnosis not present

## 2023-01-01 DIAGNOSIS — Z853 Personal history of malignant neoplasm of breast: Secondary | ICD-10-CM | POA: Diagnosis not present

## 2023-01-01 DIAGNOSIS — J4530 Mild persistent asthma, uncomplicated: Secondary | ICD-10-CM | POA: Diagnosis not present

## 2023-01-01 DIAGNOSIS — E782 Mixed hyperlipidemia: Secondary | ICD-10-CM | POA: Diagnosis not present

## 2023-01-01 DIAGNOSIS — Z713 Dietary counseling and surveillance: Secondary | ICD-10-CM | POA: Diagnosis not present

## 2023-01-01 DIAGNOSIS — Z79899 Other long term (current) drug therapy: Secondary | ICD-10-CM | POA: Diagnosis not present

## 2023-01-01 DIAGNOSIS — Z7182 Exercise counseling: Secondary | ICD-10-CM | POA: Diagnosis not present

## 2023-01-01 DIAGNOSIS — E1169 Type 2 diabetes mellitus with other specified complication: Secondary | ICD-10-CM | POA: Diagnosis not present

## 2023-01-01 DIAGNOSIS — K219 Gastro-esophageal reflux disease without esophagitis: Secondary | ICD-10-CM | POA: Diagnosis not present

## 2023-01-01 DIAGNOSIS — R011 Cardiac murmur, unspecified: Secondary | ICD-10-CM | POA: Diagnosis not present

## 2023-01-01 DIAGNOSIS — I10 Essential (primary) hypertension: Secondary | ICD-10-CM | POA: Diagnosis not present

## 2023-01-23 ENCOUNTER — Other Ambulatory Visit: Payer: Self-pay | Admitting: Internal Medicine

## 2023-01-23 DIAGNOSIS — Z1231 Encounter for screening mammogram for malignant neoplasm of breast: Secondary | ICD-10-CM

## 2023-03-06 DIAGNOSIS — U071 COVID-19: Secondary | ICD-10-CM | POA: Diagnosis not present

## 2023-03-15 ENCOUNTER — Ambulatory Visit
Admission: RE | Admit: 2023-03-15 | Discharge: 2023-03-15 | Disposition: A | Discharge status: Home / Self Care | Payer: Medicare Other | Source: Ambulatory Visit | Attending: Internal Medicine | Admitting: Internal Medicine

## 2023-03-15 DIAGNOSIS — Z1231 Encounter for screening mammogram for malignant neoplasm of breast: Secondary | ICD-10-CM | POA: Diagnosis not present

## 2023-04-23 DIAGNOSIS — E782 Mixed hyperlipidemia: Secondary | ICD-10-CM | POA: Diagnosis not present

## 2023-04-23 DIAGNOSIS — E1169 Type 2 diabetes mellitus with other specified complication: Secondary | ICD-10-CM | POA: Diagnosis not present

## 2023-04-27 DIAGNOSIS — Z853 Personal history of malignant neoplasm of breast: Secondary | ICD-10-CM | POA: Diagnosis not present

## 2023-04-27 DIAGNOSIS — E1169 Type 2 diabetes mellitus with other specified complication: Secondary | ICD-10-CM | POA: Diagnosis not present

## 2023-04-27 DIAGNOSIS — I10 Essential (primary) hypertension: Secondary | ICD-10-CM | POA: Diagnosis not present

## 2023-04-27 DIAGNOSIS — R011 Cardiac murmur, unspecified: Secondary | ICD-10-CM | POA: Diagnosis not present

## 2023-04-27 DIAGNOSIS — J4530 Mild persistent asthma, uncomplicated: Secondary | ICD-10-CM | POA: Diagnosis not present

## 2023-04-27 DIAGNOSIS — K219 Gastro-esophageal reflux disease without esophagitis: Secondary | ICD-10-CM | POA: Diagnosis not present

## 2023-04-27 DIAGNOSIS — E782 Mixed hyperlipidemia: Secondary | ICD-10-CM | POA: Diagnosis not present

## 2023-05-31 ENCOUNTER — Ambulatory Visit: Admitting: Cardiovascular Disease

## 2023-06-04 DIAGNOSIS — M79604 Pain in right leg: Secondary | ICD-10-CM | POA: Diagnosis not present

## 2023-06-04 DIAGNOSIS — M25561 Pain in right knee: Secondary | ICD-10-CM | POA: Diagnosis not present

## 2023-06-04 DIAGNOSIS — R609 Edema, unspecified: Secondary | ICD-10-CM | POA: Diagnosis not present

## 2023-06-05 ENCOUNTER — Encounter (HOSPITAL_COMMUNITY): Payer: Self-pay | Admitting: Family Medicine

## 2023-06-05 ENCOUNTER — Encounter: Payer: Self-pay | Admitting: Family Medicine

## 2023-06-05 ENCOUNTER — Other Ambulatory Visit (HOSPITAL_COMMUNITY): Payer: Self-pay | Admitting: Family Medicine

## 2023-06-05 DIAGNOSIS — M79604 Pain in right leg: Secondary | ICD-10-CM

## 2023-06-12 ENCOUNTER — Ambulatory Visit (HOSPITAL_COMMUNITY)
Admission: RE | Admit: 2023-06-12 | Discharge: 2023-06-12 | Disposition: A | Discharge status: Home / Self Care | Source: Ambulatory Visit | Attending: Family Medicine | Admitting: Family Medicine

## 2023-06-12 DIAGNOSIS — M79604 Pain in right leg: Secondary | ICD-10-CM | POA: Insufficient documentation

## 2023-06-12 DIAGNOSIS — I83811 Varicose veins of right lower extremities with pain: Secondary | ICD-10-CM | POA: Diagnosis not present

## 2023-06-15 ENCOUNTER — Telehealth: Payer: Self-pay

## 2023-06-15 NOTE — Progress Notes (Signed)
   06/15/2023  Patient ID: Eileen Mccoy, female   DOB: Sep 01, 1950, 73 y.o.   MRN: [id number]   Patient appeared on insurance report for not passing the quality metrics in 2024:  Medication Adherence for Hypertension [facility])   Outreach to the patient was Successful. Discussed BP with patient today, she checks at home, on 06/05/23 at home it was 136/61. She took a blood pressure today and it was 149/76.  Meds Tracking:  -Losartan  25 mg - Last fill 100DS on 05/08/23, BP 160/60 on 06/04/23 (acute visit for knee pain). Qualifies for metric this year, next fill due 09/14/23 (had two 100DS already this year, first fill was 02/26/23).   -Pravastatin 20 mg - Last fill 100DS on 04/09/23, LDL 101 on 04/23/23. Does not qualify for metric yet this year, next fill due 07/18/23.  Plan:  -Up to date on all medications, will continue to monitor adherence throughout the year. -Recommend increasing pravastatin to 40 mg, she will take two of her 20 mg tablets to finish up her current supply -Instructed her to continue monitoring BP 3-4 times per week and keep a log, I will review this with her on 06/29/23.   Flint Hummer, PharmD

## 2023-07-23 ENCOUNTER — Ambulatory Visit: Attending: Internal Medicine | Admitting: Internal Medicine

## 2023-07-23 ENCOUNTER — Encounter: Payer: Self-pay | Admitting: Internal Medicine

## 2023-07-23 ENCOUNTER — Telehealth: Payer: Self-pay | Admitting: Internal Medicine

## 2023-07-23 VITALS — BP 136/90 | HR 86 | Ht 62.5 in | Wt 168.6 lb

## 2023-07-23 DIAGNOSIS — R011 Cardiac murmur, unspecified: Secondary | ICD-10-CM

## 2023-07-23 DIAGNOSIS — R0609 Other forms of dyspnea: Secondary | ICD-10-CM | POA: Insufficient documentation

## 2023-07-23 NOTE — Telephone Encounter (Signed)
 Checking percert on the following patient for testing scheduled at [facility]   NM Myocar Multi W/Spect W/Wall Motion /    08/01/2023

## 2023-07-23 NOTE — Progress Notes (Signed)
 Cardiology Office Note  Date: 07/23/2023   ID: Effa, Yarrow [birth year], MRN [id number]  PCP:  Omie Bickers, MD  Cardiologist:  Lasalle Pointer, MD Electrophysiologist:  None   History of Present Illness: Eileen Mccoy is a 73 y.o. female  Cardiorespiratory include HTN, HLD.  Ongoing history of DOE for the last 3 months.  No orthopnea, PND, leg swelling.  She is still able to do her daily household chores but she is noticing getting more short of breath than previously.  Her younger sister passed away around 10 years ago with presumed heart attack.  Patient did not have any prior history of MI/PCI/CABG.  Does not report any dizziness, syncope, palpitations.  No angina.  Past Medical History:  Diagnosis Date   Asthma 02/23/2016   Breast cancer (HCC)    Ductal carcinoma in situ (DCIS) of right breast 01/21/2019   GERD (gastroesophageal reflux disease)    H. pylori infection    Hypertension    Mixed hyperlipidemia 09/24/2020   Vertigo     Past Surgical History:  Procedure Laterality Date   ABDOMINAL HYSTERECTOMY     BIOPSY  03/16/2016   Procedure: BIOPSY;  Surgeon: Ruby Corporal, MD;  Location: [facility];  Service: Endoscopy;;  gastric biopsies   COLONOSCOPY WITH PROPOFOL  N/A 04/21/2020   rehman: - One 2 mm polyp in the transverse colon, removed with a cold biopsy forceps (tubular adenoma), non bleeding internal hemorrhoids   ESOPHAGOGASTRODUODENOSCOPY N/A 03/16/2016   rehman: Normal esophagus. z line irregular 39 cm from incisors, gastritis, positive for Hp, treated with pylera, two gastric polyps, foveolar hyperplasia, normal duodenal bulb and second portion of duodenum   MASTECTOMY Right    MASTECTOMY W/ SENTINEL NODE BIOPSY Right 03/11/2019   Procedure: RIGHT MASTECTOMY WITH RIGHT AXILLARY SENTINEL LYMPH NODE BIOPSY;  Surgeon: Juanita Norlander, MD;  Location: [facility];  Service: General;  Laterality: Right;   PARTIAL HYSTERECTOMY      1998 for fibroids   POLYPECTOMY  03/16/2016   Procedure: POLYPECTOMY;  Surgeon: Ruby Corporal, MD;  Location: [facility];  Service: Endoscopy;;  gastric polypectomies   POLYPECTOMY  04/21/2020   Procedure: POLYPECTOMY INTESTINAL;  Surgeon: Urban Garden, MD;  Location: [facility];  Service: Gastroenterology;;  transverse colon polyp;    THYROIDECTOMY, PARTIAL      Current Outpatient Medications  Medication Sig Dispense Refill   acetaminophen  (TYLENOL ) 650 MG CR tablet Take 650 mg by mouth every 8 (eight) hours as needed for pain.     albuterol  (2.5 MG/3ML) 0.083% NEBU 3 mL, albuterol  (5 MG/ML) 0.5% NEBU 0.5 mL Inhale into the lungs.     Albuterol  Sulfate 108 (90 Base) MCG/ACT AEPB Inhale 2 puffs into the lungs every 6 (six) hours as needed (shortness of breath). 1 each 1   Artificial Tear Solution (SOOTHE XP OP) Place 1 drop into both eyes in the morning and at bedtime.     cetirizine (ZYRTEC) 10 MG tablet Take 10 mg by mouth daily as needed.     Cholecalciferol (VITAMIN D3) 25 MCG (1000 UT) CAPS Take 1,000 Units by mouth daily.     Fluticasone -Salmeterol (WIXELA INHUB IN) Inhale 2 puffs into the lungs.     hydrochlorothiazide  (HYDRODIURIL ) 25 MG tablet Take 25 mg by mouth daily.   3   losartan  (COZAAR ) 25 MG tablet Take 25 mg by mouth daily.     meclizine  (ANTIVERT ) 25 MG tablet Take  1 tablet (25 mg total) by mouth 3 (three) times daily as needed for dizziness. 30 tablet 0   Multiple Vitamin (MULTIVITAMIN) tablet Take 1 tablet by mouth daily.     OMEGA-3 FATTY ACIDS PO Take by mouth daily. 2000 mg daily     omeprazole  (PRILOSEC) 20 MG capsule Take 1 capsule (20 mg total) by mouth daily. 90 capsule 3   pravastatin (PRAVACHOL) 10 MG tablet Take 10 mg by mouth at bedtime.     Sennosides-Docusate Sodium (SENNA S PO) Take 1 tablet by mouth 2 (two) times daily as needed.     sodium chloride  (OCEAN) 0.65 % SOLN nasal spray Place 1 spray into both nostrils as needed for  congestion.     No current facility-administered medications for this visit.   Allergies:  Codeine   Social History: The patient  reports that she has never smoked. She has never used smokeless tobacco. She reports current alcohol  use. She reports that she does not use drugs.   Family History: The patient's family history includes Alcohol  abuse in her brother and father; Asthma in her mother; COPD (age of onset: 70) in her sister; COPD (age of onset: 76) in her mother; Cancer in her father; Heart attack in her mother; Heart disease in her mother; Heart disease (age of onset: 22) in her sister; Lung cancer in her father.   ROS:  Please see the history of present illness. Otherwise, complete review of systems is positive for none  All other systems are reviewed and negative.   Physical Exam: VS:  BP (!) 136/90   Pulse 86   Ht 5' 2.5" (1.588 m)   Wt 168 lb 9.6 oz (76.5 kg)   LMP 02/21/1976 (Approximate)   SpO2 97%   BMI 30.35 kg/m , BMI Body mass index is 30.35 kg/m.  Wt Readings from Last 3 Encounters:  07/23/23 168 lb 9.6 oz (76.5 kg)  06/11/22 165 lb (74.8 kg)  05/02/22 164 lb (74.4 kg)    General: Patient appears comfortable at rest. HEENT: Conjunctiva and lids normal, oropharynx clear with moist mucosa. Neck: Supple, no elevated JVP or carotid bruits, no thyromegaly. Lungs: Clear to auscultation, nonlabored breathing at rest. Cardiac: Regular rate and rhythm, no S3 or significant systolic murmur, no pericardial rub. Abdomen: Soft, nontender, no hepatomegaly, bowel sounds present, no guarding or rebound. Extremities: No pitting edema, distal pulses 2+. Skin: Warm and dry. Musculoskeletal: No kyphosis. Neuropsychiatric: Alert and oriented x3, affect grossly appropriate.  Recent Labwork: No results found for requested labs within last 365 days.  No results found for: "CHOL", "TRIG", "HDL", "CHOLHDL", "VLDL", "LDLCALC", "LDLDIRECT"  Assessment and Plan:   DOE: Ongoing DOE  for the last 3 months.  She noticed these with daily activities/chores, still able to do them.  No angina.  Obtain echocardiogram and exercise Myoview.  HTN, controlled: Continue current hypertensives, HCTZ 25 mg once daily, losartan  25 mg once daily.  Goal BP less than 140/90 mmHg.  HLD, known values: Continue pravastatin 10 mg at bedtime, goal LDL is 100.  Follows with PCP.      Medication Adjustments/Labs and Tests Ordered: Current medicines are reviewed at length with the patient today.  Concerns regarding medicines are outlined above.    Disposition:  Follow up pending results  Signed Jerrilyn Messinger Priya Khristie Sak, MD, 07/23/2023 3:10 PM    [facility] [address] [place], [place], [place] [address]

## 2023-07-23 NOTE — Patient Instructions (Signed)
 Medication Instructions:  Your physician recommends that you continue on your current medications as directed. Please refer to the Current Medication list given to you today.   Labwork: None  Testing/Procedures: Your physician has requested that you have an echocardiogram. Echocardiography is a painless test that uses sound waves to create images of your heart. It provides your doctor with information about the size and shape of your heart and how well your heart's chambers and valves are working. This procedure takes approximately one hour. There are no restrictions for this procedure. Please do NOT wear cologne, perfume, aftershave, or lotions (deodorant is allowed). Please arrive 15 minutes prior to your appointment time.  Please note: We ask at that you not bring children with you during ultrasound (echo/ vascular) testing. Due to room size and safety concerns, children are not allowed in the ultrasound rooms during exams. Our front office staff cannot provide observation of children in our lobby area while testing is being conducted. An adult accompanying a patient to their appointment will only be allowed in the ultrasound room at the discretion of the ultrasound technician under special circumstances. We apologize for any inconvenience.  Your physician has requested that you have en exercise stress myoview. For further information please visit [email or web address] Please follow instruction sheet, as given.   Follow-Up: Your physician recommends that you schedule a follow-up appointment in: Pending Results  Any Other Special Instructions Will Be Listed Below (If Applicable). Thank you for choosing [facility]!     If you need a refill on your cardiac medications before your next appointment, please call your pharmacy.

## 2023-08-01 ENCOUNTER — Ambulatory Visit (HOSPITAL_COMMUNITY)
Admission: RE | Admit: 2023-08-01 | Discharge: 2023-08-01 | Disposition: A | Discharge status: Home / Self Care | Source: Ambulatory Visit | Attending: Internal Medicine | Admitting: Internal Medicine

## 2023-08-01 ENCOUNTER — Encounter (HOSPITAL_BASED_OUTPATIENT_CLINIC_OR_DEPARTMENT_OTHER)
Admission: RE | Admit: 2023-08-01 | Discharge: 2023-08-01 | Disposition: A | Discharge status: Home / Self Care | Source: Ambulatory Visit | Attending: Internal Medicine

## 2023-08-01 DIAGNOSIS — R0609 Other forms of dyspnea: Secondary | ICD-10-CM

## 2023-08-01 LAB — NM MYOCAR MULTI W/SPECT W/WALL MOTION / EF
Angina Index: 0
Duke Treadmill Score: 3
Estimated workload: 4.6
Exercise duration (min): 3 min
Exercise duration (sec): 7 s
LV dias vol: 51 mL (ref 46–106)
LV sys vol: 6 mL
MPHR: 145 {beats}/min
Nuc Stress EF: 87 %
Peak HR: 129 {beats}/min
Percent HR: 88 %
RATE: 0.4
RPE: 13
Rest HR: 89 {beats}/min
Rest Nuclear Isotope Dose: 10.7 mCi
SDS: 2
SRS: 0
SSS: 2
ST Depression (mm): 0 mm
Stress Nuclear Isotope Dose: 32.4 mCi
TID: 1.04

## 2023-08-01 MED ORDER — TECHNETIUM TC 99M TETROFOSMIN IV KIT
10.7000 | PACK | Freq: Once | INTRAVENOUS | Status: AC | PRN
  Administered 2023-08-01: 10.7 via INTRAVENOUS

## 2023-08-01 MED ORDER — TECHNETIUM TC 99M TETROFOSMIN IV KIT
32.4000 | PACK | Freq: Once | INTRAVENOUS | Status: AC | PRN
  Administered 2023-08-01: 32.4 via INTRAVENOUS

## 2023-08-03 ENCOUNTER — Ambulatory Visit: Payer: Self-pay | Admitting: Internal Medicine

## 2023-08-07 ENCOUNTER — Ambulatory Visit: Attending: Internal Medicine

## 2023-08-07 DIAGNOSIS — R0609 Other forms of dyspnea: Secondary | ICD-10-CM | POA: Diagnosis not present

## 2023-08-08 LAB — ECHOCARDIOGRAM COMPLETE
AR max vel: 2.61 cm2
AV Area VTI: 3.1 cm2
AV Area mean vel: 2.53 cm2
AV Mean grad: 4 mmHg
AV Peak grad: 6.3 mmHg
Ao pk vel: 1.25 m/s
Area-P 1/2: 3.3 cm2
Calc EF: 75.2 %
MV VTI: 2.69 cm2
S' Lateral: 2.2 cm
Single Plane A2C EF: 73.5 %
Single Plane A4C EF: 76.6 %

## 2023-08-09 ENCOUNTER — Other Ambulatory Visit

## 2023-08-22 DIAGNOSIS — E1169 Type 2 diabetes mellitus with other specified complication: Secondary | ICD-10-CM | POA: Diagnosis not present

## 2023-08-22 DIAGNOSIS — E782 Mixed hyperlipidemia: Secondary | ICD-10-CM | POA: Diagnosis not present

## 2023-08-28 ENCOUNTER — Other Ambulatory Visit (HOSPITAL_COMMUNITY): Payer: Self-pay | Admitting: Family Medicine

## 2023-08-28 DIAGNOSIS — J4530 Mild persistent asthma, uncomplicated: Secondary | ICD-10-CM | POA: Diagnosis not present

## 2023-08-28 DIAGNOSIS — E782 Mixed hyperlipidemia: Secondary | ICD-10-CM | POA: Diagnosis not present

## 2023-08-28 DIAGNOSIS — M25561 Pain in right knee: Secondary | ICD-10-CM | POA: Diagnosis not present

## 2023-08-28 DIAGNOSIS — E1169 Type 2 diabetes mellitus with other specified complication: Secondary | ICD-10-CM | POA: Diagnosis not present

## 2023-08-28 DIAGNOSIS — R011 Cardiac murmur, unspecified: Secondary | ICD-10-CM | POA: Diagnosis not present

## 2023-08-28 DIAGNOSIS — R609 Edema, unspecified: Secondary | ICD-10-CM | POA: Diagnosis not present

## 2023-08-28 DIAGNOSIS — K219 Gastro-esophageal reflux disease without esophagitis: Secondary | ICD-10-CM | POA: Diagnosis not present

## 2023-08-28 DIAGNOSIS — I10 Essential (primary) hypertension: Secondary | ICD-10-CM | POA: Diagnosis not present

## 2023-08-28 DIAGNOSIS — M79604 Pain in right leg: Secondary | ICD-10-CM | POA: Diagnosis not present

## 2023-08-28 DIAGNOSIS — Z13820 Encounter for screening for osteoporosis: Secondary | ICD-10-CM

## 2023-09-04 ENCOUNTER — Other Ambulatory Visit (HOSPITAL_COMMUNITY)

## 2023-09-07 ENCOUNTER — Ambulatory Visit (HOSPITAL_COMMUNITY)
Admission: RE | Admit: 2023-09-07 | Discharge: 2023-09-07 | Disposition: A | Discharge status: Home / Self Care | Source: Ambulatory Visit | Attending: Family Medicine | Admitting: Family Medicine

## 2023-09-07 DIAGNOSIS — Z13820 Encounter for screening for osteoporosis: Secondary | ICD-10-CM | POA: Insufficient documentation

## 2023-09-07 DIAGNOSIS — Z780 Asymptomatic menopausal state: Secondary | ICD-10-CM | POA: Diagnosis not present

## 2023-10-15 DIAGNOSIS — H04123 Dry eye syndrome of bilateral lacrimal glands: Secondary | ICD-10-CM | POA: Diagnosis not present

## 2023-10-15 DIAGNOSIS — H25813 Combined forms of age-related cataract, bilateral: Secondary | ICD-10-CM | POA: Diagnosis not present

## 2023-10-15 DIAGNOSIS — H40013 Open angle with borderline findings, low risk, bilateral: Secondary | ICD-10-CM | POA: Diagnosis not present

## 2023-10-30 DIAGNOSIS — R059 Cough, unspecified: Secondary | ICD-10-CM | POA: Diagnosis not present

## 2023-10-30 DIAGNOSIS — J019 Acute sinusitis, unspecified: Secondary | ICD-10-CM | POA: Diagnosis not present

## 2023-11-19 DIAGNOSIS — Z23 Encounter for immunization: Secondary | ICD-10-CM | POA: Diagnosis not present

## 2023-12-05 ENCOUNTER — Encounter (INDEPENDENT_AMBULATORY_CARE_PROVIDER_SITE_OTHER): Payer: Self-pay | Admitting: Gastroenterology

## 2023-12-12 DIAGNOSIS — H2513 Age-related nuclear cataract, bilateral: Secondary | ICD-10-CM | POA: Diagnosis not present

## 2024-01-21 ENCOUNTER — Other Ambulatory Visit: Payer: Self-pay | Admitting: Internal Medicine

## 2024-01-21 DIAGNOSIS — Z1231 Encounter for screening mammogram for malignant neoplasm of breast: Secondary | ICD-10-CM

## 2024-03-17 ENCOUNTER — Ambulatory Visit

## 2024-04-15 ENCOUNTER — Ambulatory Visit

## 2024-04-28 ENCOUNTER — Ambulatory Visit
